# Patient Record
Sex: Male | Born: 1975 | Race: White | Hispanic: No | Marital: Single | Smoking: Never smoker
Health system: Southern US, Community
[De-identification: ages and names within clinical notes are randomized; demographics above are authoritative.]

## PROBLEM LIST (undated history)

## (undated) DIAGNOSIS — I10 Essential (primary) hypertension: Secondary | ICD-10-CM

## (undated) DIAGNOSIS — E079 Disorder of thyroid, unspecified: Secondary | ICD-10-CM

## (undated) HISTORY — DX: Disorder of thyroid, unspecified: E07.9

## (undated) HISTORY — DX: Essential (primary) hypertension: I10

## (undated) HISTORY — PX: OTHER SURGICAL HISTORY: SHX169

## (undated) HISTORY — PX: BRAIN SURGERY: SHX531

---

## 2012-07-10 ENCOUNTER — Emergency Department (HOSPITAL_COMMUNITY)
Admission: EM | Admit: 2012-07-10 | Discharge: 2012-07-10 | Disposition: A | Discharge status: Home / Self Care | Payer: BC Managed Care – PPO | Attending: Emergency Medicine | Admitting: Emergency Medicine

## 2012-07-10 ENCOUNTER — Emergency Department (HOSPITAL_COMMUNITY): Payer: BC Managed Care – PPO

## 2012-07-10 ENCOUNTER — Encounter (HOSPITAL_COMMUNITY): Payer: Self-pay | Admitting: Emergency Medicine

## 2012-07-10 DIAGNOSIS — W010XXA Fall on same level from slipping, tripping and stumbling without subsequent striking against object, initial encounter: Secondary | ICD-10-CM | POA: Insufficient documentation

## 2012-07-10 DIAGNOSIS — S82899A Other fracture of unspecified lower leg, initial encounter for closed fracture: Secondary | ICD-10-CM | POA: Insufficient documentation

## 2012-07-10 DIAGNOSIS — S82401A Unspecified fracture of shaft of right fibula, initial encounter for closed fracture: Secondary | ICD-10-CM

## 2012-07-10 DIAGNOSIS — Y9289 Other specified places as the place of occurrence of the external cause: Secondary | ICD-10-CM | POA: Insufficient documentation

## 2012-07-10 DIAGNOSIS — Y93G9 Activity, other involving cooking and grilling: Secondary | ICD-10-CM | POA: Insufficient documentation

## 2012-07-10 MED ORDER — HYDROCODONE-ACETAMINOPHEN 5-325 MG PO TABS: 2.0000 | ORAL_TABLET | Freq: Four times a day (QID) | ORAL | Status: DC | PRN

## 2012-07-10 MED ORDER — OXYCODONE-ACETAMINOPHEN 5-325 MG PO TABS
2.0000 | ORAL_TABLET | Freq: Once | ORAL | Status: AC
  Administered 2012-07-10: 2 via ORAL
  Filled 2012-07-10: qty 2

## 2012-07-10 NOTE — ED Notes (Signed)
Ortho tech paged for application of short leg stirrup splint and crutches.

## 2012-07-10 NOTE — ED Notes (Signed)
Pt alert, arrives from home, c/o left ankle pain associated from slip fall injury ,resp even unlabored,  skin pwd

## 2012-07-10 NOTE — ED Notes (Signed)
Pt has a ride home.  

## 2012-07-10 NOTE — ED Notes (Signed)
Pt slipped, injuring L ankle. Unable to see if deformed at this time.

## 2012-07-10 NOTE — ED Provider Notes (Signed)
History    This chart was scribed for non-physician practitioner working with Gwyneth Sprout, MD by Frederik Pear, ED Scribe. This patient was seen in room WTR9/WTR9 and the patient's care was started at 2133.   CSN: [id number]  Arrival date & time 07/10/12  2000   First MD Initiated Contact with Patient 07/10/12 2133      Chief Complaint  Patient presents with  . Ankle Injury    (Consider location/radiation/quality/duration/timing/severity/associated sxs/prior treatment) The history is provided by the patient. No language interpreter was used.    Micheal Day is a 37 y.o. male who presents to the Emergency Department complaining of sudden onset, constant, non-radiating, moderate left ankle pain that began earlier this evening while he was cooking dinner outside when he turned around and slipped and fell in the mud. He denies any associated symptoms. He is allergic to celebrex and sulfa antibiotics. He has not tried anything for his symptoms.  History reviewed. No pertinent past medical history.  History reviewed. No pertinent past surgical history.  No family history on file.  History  Substance Use Topics  . Smoking status: Never Smoker   . Smokeless tobacco: Not on file  . Alcohol Use: Yes      Review of Systems A complete 10 system review of systems was obtained and all systems are negative except as noted in the HPI and PMH.  Allergies  Celebrex and Sulfa antibiotics  Home Medications   Current Outpatient Rx  Name  Route  Sig  Dispense  Refill  . diphenhydrAMINE (BENADRYL) 25 MG tablet   Oral   Take 25 mg by mouth every 6 (six) hours as needed for itching. Allergies         . naproxen sodium (ANAPROX) 220 MG tablet   Oral   Take 220 mg by mouth as needed. Fever           BP 131/96  Pulse 108  Temp(Src) 99 F (37.2 C) (Oral)  Resp 16  Wt 206 lb (93.441 kg)  SpO2 97%  Physical Exam  Nursing note and vitals reviewed. Constitutional: He is  oriented to person, place, and time. He appears well-developed and well-nourished. No distress.  HENT:  Head: Normocephalic and atraumatic.  Eyes: EOM are normal. Pupils are equal, round, and reactive to light.  Neck: Normal range of motion. Neck supple. No tracheal deviation present.  Cardiovascular: Normal rate.   Good distal pulses and capillary refill.  Pulmonary/Chest: Effort normal. No respiratory distress.  Abdominal: Soft. He exhibits no distension.  Musculoskeletal: Normal range of motion. He exhibits tenderness. He exhibits no edema.  Tender over the lateral aspect of the left ankle. Moderate swelling.    Neurological: He is alert and oriented to person, place, and time.  Sensation is intact.  Skin: Skin is warm and dry.  Psychiatric: He has a normal mood and affect. His behavior is normal.    ED Course  Procedures (including critical care time  DIAGNOSTIC STUDIES: Oxygen Saturation is 97% on room air, adequate by my interpretation.    COORDINATION OF CARE:  22:05- Discussed planned course of treatment with the patient, including Percocet, who is agreeable at this time.  22:15- Medication Orders- oxycodone-acetaminophen (percocet/roxicet) 5-325 mg per tablet 2 tablet- once.   22:36- Discussed X-ray findings with the pt. He is agreeable to being discharged with pain medication, a splint and crutches, and a follow up with an orthopedist in 2 days.  Labs Reviewed - No data to  display Dg Ankle Complete Left  07/10/2012  *RADIOLOGY REPORT*  Clinical Data: Ankle pain, slipped in bud and its  LEFT ANKLE COMPLETE - 3+ VIEW  Comparison: None.  Findings: Nondisplaced oblique spiral fracture of the distal fibula extending to the level of the tibial plafond.  There is associated soft tissue swelling about the lateral malleolus.  The remainder of the bones and joints appear intact.  Bony mineralization is within normal limits.  There may be a small ankle joint effusion.  IMPRESSION:  Nondisplaced spiral fracture of the distal fibula extending to the level of the tibial plafond. Probable small ankle joint effusion.   Original Report Authenticated By: Malachy Moan, M.D.      1. Fibula fracture, right, closed, initial encounter       MDM  Distal fibular fracture. Discussed patient with Dr. Anitra Lauth, and tells him that the patient in short leg splint, given crutches, and are followup. Patient will be nonweightbearing until or followup. Pain medicines will be given. Patient is neurovascularly intact. No open wounds. Patient stable and ready for discharge.  I personally performed the services described in this documentation, which was scribed in my presence. The recorded information has been reviewed and is accurate.         Roxy Horseman, PA-C 07/10/12 2336

## 2012-07-11 NOTE — ED Provider Notes (Signed)
Medical screening examination/treatment/procedure(s) were performed by non-physician practitioner and as supervising physician I was immediately available for consultation/collaboration.   Gwyneth Sprout, MD 07/11/12 [phone]

## 2014-01-02 ENCOUNTER — Ambulatory Visit (INDEPENDENT_AMBULATORY_CARE_PROVIDER_SITE_OTHER): Payer: BC Managed Care – PPO | Admitting: Family Medicine

## 2014-01-02 ENCOUNTER — Other Ambulatory Visit: Payer: Self-pay | Admitting: Family Medicine

## 2014-01-02 ENCOUNTER — Encounter: Payer: Self-pay | Admitting: Family Medicine

## 2014-01-02 VITALS — BP 105/105 | HR 87 | Temp 97.5°F | Resp 16 | Ht 70.0 in | Wt 187.8 lb

## 2014-01-02 DIAGNOSIS — E039 Hypothyroidism, unspecified: Secondary | ICD-10-CM

## 2014-01-02 DIAGNOSIS — E049 Nontoxic goiter, unspecified: Secondary | ICD-10-CM

## 2014-01-02 DIAGNOSIS — I10 Essential (primary) hypertension: Secondary | ICD-10-CM

## 2014-01-02 DIAGNOSIS — E041 Nontoxic single thyroid nodule: Secondary | ICD-10-CM

## 2014-01-02 LAB — CBC WITH DIFFERENTIAL/PLATELET
BASOS ABS: 0.1 10*3/uL (ref 0.0–0.1)
BASOS PCT: 2 % — AB (ref 0–1)
EOS ABS: 0.1 10*3/uL (ref 0.0–0.7)
EOS PCT: 2 % (ref 0–5)
HCT: 39.9 % (ref 39.0–52.0)
Hemoglobin: 14.1 g/dL (ref 13.0–17.0)
LYMPHS ABS: 1.3 10*3/uL (ref 0.7–4.0)
Lymphocytes Relative: 28 % (ref 12–46)
MCH: 31.8 pg (ref 26.0–34.0)
MCHC: 35.3 g/dL (ref 30.0–36.0)
MCV: 89.9 fL (ref 78.0–100.0)
Monocytes Absolute: 0.4 10*3/uL (ref 0.1–1.0)
Monocytes Relative: 8 % (ref 3–12)
Neutro Abs: 2.8 10*3/uL (ref 1.7–7.7)
Neutrophils Relative %: 60 % (ref 43–77)
PLATELETS: 220 10*3/uL (ref 150–400)
RBC: 4.44 MIL/uL (ref 4.22–5.81)
RDW: 13.6 % (ref 11.5–15.5)
WBC: 4.6 10*3/uL (ref 4.0–10.5)

## 2014-01-02 LAB — COMPREHENSIVE METABOLIC PANEL
ALK PHOS: 55 U/L (ref 39–117)
ALT: 141 U/L — AB (ref 0–53)
AST: 101 U/L — ABNORMAL HIGH (ref 0–37)
Albumin: 4.8 g/dL (ref 3.5–5.2)
BILIRUBIN TOTAL: 0.4 mg/dL (ref 0.2–1.2)
BUN: 8 mg/dL (ref 6–23)
CO2: 26 meq/L (ref 19–32)
CREATININE: 0.72 mg/dL (ref 0.50–1.35)
Calcium: 9.5 mg/dL (ref 8.4–10.5)
Chloride: 100 mEq/L (ref 96–112)
Glucose, Bld: 99 mg/dL (ref 70–99)
Potassium: 4.1 mEq/L (ref 3.5–5.3)
Sodium: 136 mEq/L (ref 135–145)
Total Protein: 7.3 g/dL (ref 6.0–8.3)

## 2014-01-02 LAB — TSH: TSH: 26.752 u[IU]/mL — AB (ref 0.350–4.500)

## 2014-01-02 LAB — T4, FREE: Free T4: 0.85 ng/dL (ref 0.80–1.80)

## 2014-01-02 LAB — T3, FREE: T3, Free: 2.9 pg/mL (ref 2.3–4.2)

## 2014-01-02 MED ORDER — LISINOPRIL 10 MG PO TABS: 10.0000 mg | ORAL_TABLET | Freq: Every day | ORAL | Status: DC

## 2014-01-02 NOTE — Patient Instructions (Signed)
I will be in touch with your labs asap.  We will also get you set up for an ultrasound of your thyroid and will give you these details.   Start taking lisinopril  once a day for your blood pressure.  If you are able, check your blood pressure once or twice a week and let me know how it looks. Let's plan to recheck in about 6 weeks at clinic.

## 2014-01-02 NOTE — Progress Notes (Addendum)
[facility] [address], [place] [address] [phone]  Date:  01/02/2014   Name:  Micheal Day   DOB:  1975/05/29   MRN:  [id number]  PCP:  No primary provider on file.    Chief Complaint: Hypertension   History of Present Illness:  Micheal Day is a 38 y.o. very pleasant male patient who presents with the following:  He is here today as a new patient- he has been seen by our office for a WC issues in the past but never for a personal issue.  Dr. Marin Comment noted that he seems to have a goiter when he was here for [facility], so he is here today for follow-up.  He has noted this goiter for about 2 years, but it seems to have gotten bigger over the last few weeks.  It does not hurt, and does not give an issue with swallowing or breathing.  However he has noted it when he shaves his neck. He notes that "my blood pressure has always been high."  However he has never been on any medication for this. He has been told that his BP was high for about 15 years.  He avoids salt, and eats a lot of vegetables in addition to getting exercise  He thinks that his father has HTN but he is not sure, he does not have a lot of family and is not sure about other history.    There are no active problems to display for this patient.   No past medical history on file.  No past surgical history on file.  History  Substance Use Topics  . Smoking status: Never Smoker   . Smokeless tobacco: Not on file  . Alcohol Use: Yes    No family history on file.  Allergies  Allergen Reactions  . Celebrex [Celecoxib]   . Sulfa Antibiotics     Medication list has been reviewed and updated.  Current Outpatient Prescriptions on File Prior to Visit  Medication Sig Dispense Refill  . diphenhydrAMINE (BENADRYL) 25 MG tablet Take 25 mg by mouth every 6 (six) hours as needed for itching. Allergies      . HYDROcodone-acetaminophen (NORCO/VICODIN) 5-325 MG per tablet Take 2 tablets by mouth every 6  (six) hours as needed for pain.  15 tablet  0  . naproxen sodium (ANAPROX) 220 MG tablet Take 220 mg by mouth as needed. Fever       No current facility-administered medications on file prior to visit.    Review of Systems:  As per HPI- otherwise negative.   Physical Examination: Filed Vitals:   01/02/14 1050  BP: 164/112  Pulse: 87  Temp: 97.5 F (36.4 C)  Resp: 16   Filed Vitals:   01/02/14 1050  Height: 5' 10"  (1.778 m)  Weight: 187 lb 12.8 oz (85.186 kg)   Body mass index is 26.95 kg/(m^2). Ideal Body Weight: Weight in (lb) to have BMI = 25: 173.9  GEN: WDWN, NAD, Non-toxic, A & O x 3, normal weight, looks well HEENT: Atraumatic, Normocephalic. Neck supple. No masses, No LAD. He has a fairly large goiter that is smooth and cystic to palpation.   Bilateral TM wnl, oropharynx normal.  PEERL,EOMI.   Ears and Nose: No external deformity. CV: RRR, No M/G/R. No JVD. No thrill. No extra heart sounds. PULM: CTA B, no wheezes, crackles, rhonchi. No retractions. No resp. distress. No accessory muscle use. ABD: S, NT, ND. EXTR: No c/c/e  NEURO Normal gait.  PSYCH: Normally interactive. Conversant. Not depressed or anxious appearing.  Calm demeanor.    Assessment and Plan: Goiter - Plan: US Soft Tissue Head/Neck, TSH, T3, Free, T4, Free  Essential hypertension - Plan: CBC with Differential, Comprehensive metabolic panel, lisinopril (PRINIVIL,ZESTRIL) 10 MG tablet  Referral for ultrasound to further evaluate his goiter, and check labs as above.   Start lisinopril for HTN.  He will call me with an update regarding his BP in the next few weeks.  See patient instructions for more details.     Signed Lamar Blinks, MD  Received labs and [place] report 01/05/14 THYROID ULTRASOUND  TECHNIQUE: Ultrasound examination of the thyroid gland and adjacent soft tissues was performed.  COMPARISON: None.  FINDINGS: Right thyroid lobe  Measurements: 4.7 x 2.1 x 2.6 cm. Multiple  midpole and lower pole nodules are present. Glandular tissue is heterogeneous. 1.2 x 0.7 x 1.8 cm interpolar region solid nodule. 1.7 x 1.0 x 1.7 cm lower pole nodule. 2.1 x 1.3 x 1.7 cm complex solid and cystic lower pole nodule.  Left thyroid lobe  Measurements: 4.5 x 1.4 x 2.5 cm. Several nodules are present in the left lobe. There are 2 upper pole nodules measuring 9 mm each. Interpolar region 8 mm nodule. Complex 1.4 x 1.1 x 1.8 cm lower pole nodule. Solid 1.2 x 0.9 x 1.3 cm lower pole nodule.  Isthmus  Thickness: 12 mm. No nodules visualized.  Lymphadenopathy  None visualized.  IMPRESSION: There are multiple bilateral solid and complex nodules. The solid 1.8 cm and solid 1.7 cm nodules in the right lobe meet criteria for biopsy. Findings meet consensus criteria for biopsy. Ultrasound-guided fine needle aspiration should be considered, as per the consensus statement: Management of Thyroid Nodules Detected at [place]: Society of Radiologists in [place]. Radiology 2005; [id number].   Results for orders placed in visit on 01/02/14  CBC WITH DIFFERENTIAL      Result Value Ref Range   WBC 4.6  4.0 - 10.5 K/uL   RBC 4.44  4.22 - 5.81 MIL/uL   Hemoglobin 14.1  13.0 - 17.0 g/dL   HCT 39.9  39.0 - 52.0 %   MCV 89.9  78.0 - 100.0 fL   MCH 31.8  26.0 - 34.0 pg   MCHC 35.3  30.0 - 36.0 g/dL   RDW 13.6  11.5 - 15.5 %   Platelets 220  150 - 400 K/uL   Neutrophils Relative % 60  43 - 77 %   Neutro Abs 2.8  1.7 - 7.7 K/uL   Lymphocytes Relative 28  12 - 46 %   Lymphs Abs 1.3  0.7 - 4.0 K/uL   Monocytes Relative 8  3 - 12 %   Monocytes Absolute 0.4  0.1 - 1.0 K/uL   Eosinophils Relative 2  0 - 5 %   Eosinophils Absolute 0.1  0.0 - 0.7 K/uL   Basophils Relative 2 (*) 0 - 1 %   Basophils Absolute 0.1  0.0 - 0.1 K/uL   Smear Review Criteria for review not met    COMPREHENSIVE METABOLIC PANEL      Result Value Ref Range   Sodium 136  135 - 145  mEq/L   Potassium 4.1  3.5 - 5.3 mEq/L   Chloride 100  96 - 112 mEq/L   CO2 26  19 - 32 mEq/L   Glucose, Bld 99  70 - 99 mg/dL   BUN 8  6 -  23 mg/dL   Creat 0.72  0.50 - 1.35 mg/dL   Total Bilirubin 0.4  0.2 - 1.2 mg/dL   Alkaline Phosphatase 55  39 - 117 U/L   AST 101 (*) 0 - 37 U/L   ALT 141 (*) 0 - 53 U/L   Total Protein 7.3  6.0 - 8.3 g/dL   Albumin 4.8  3.5 - 5.2 g/dL   Calcium 9.5  8.4 - 10.5 mg/dL  TSH      Result Value Ref Range   TSH 26.752 (*) 0.350 - 4.500 uIU/mL  T3, FREE      Result Value Ref Range   T3, Free 2.9  2.3 - 4.2 pg/mL  T4, FREE      Result Value Ref Range   Free T4 0.85  0.80 - 1.80 ng/dL   Also did an acute hep panel which was negative (to follow-up elevated LFTs).   Called to go over with pt but he did not answer at this time.  LMOM that I will try back later  Called him on 9/4: discussed with pt  1. Start synthroid 63mg. Plan to recheck TSH in about 3 weeks either with u[place] with endocrine 2. Refer to endocrinology 3. LFTs are high.  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  He is going to work on drinking less and we will plan to recheck LFTs in about 1 months

## 2014-01-04 ENCOUNTER — Ambulatory Visit
Admission: RE | Admit: 2014-01-04 | Discharge: 2014-01-04 | Disposition: A | Discharge status: Home / Self Care | Payer: BC Managed Care – PPO | Source: Ambulatory Visit | Attending: Family Medicine | Admitting: Family Medicine

## 2014-01-04 DIAGNOSIS — E049 Nontoxic goiter, unspecified: Secondary | ICD-10-CM

## 2014-01-04 LAB — HEPATITIS PANEL, ACUTE
HCV AB: NEGATIVE
HEP B S AG: NEGATIVE
Hep A IgM: NONREACTIVE
Hep B C IgM: NONREACTIVE

## 2014-01-05 ENCOUNTER — Encounter: Payer: Self-pay | Admitting: Family Medicine

## 2014-01-05 NOTE — Addendum Note (Signed)
Addended by: Abbe Amsterdam C on: 01/05/2014 05:06 PM   Modules accepted: Orders

## 2014-01-06 ENCOUNTER — Encounter: Payer: Self-pay | Admitting: Family Medicine

## 2014-01-06 MED ORDER — LEVOTHYROXINE SODIUM 25 MCG PO TABS: 25.0000 ug | ORAL_TABLET | Freq: Every day | ORAL | Status: DC

## 2014-01-06 NOTE — Addendum Note (Signed)
Addended by: Abbe Amsterdam C on: 01/06/2014 05:53 PM   Modules accepted: Orders

## 2014-01-09 ENCOUNTER — Encounter: Payer: Self-pay | Admitting: Family Medicine

## 2014-01-10 NOTE — Telephone Encounter (Signed)
That sounds pretty good.  I would like to get your top number a little lower, but certainly progress.  If you would, check your pressure every 2 or 3 days for the next 2 weeks and then email me with some more readings.  Then we can decide if we should increase your dose of medication.  Thanks for the update!

## 2014-01-19 ENCOUNTER — Encounter: Payer: Self-pay | Admitting: Family Medicine

## 2014-01-20 ENCOUNTER — Encounter: Payer: Self-pay | Admitting: Internal Medicine

## 2014-02-01 ENCOUNTER — Other Ambulatory Visit: Payer: Self-pay | Admitting: *Deleted

## 2014-02-01 ENCOUNTER — Encounter: Payer: Self-pay | Admitting: Endocrinology

## 2014-02-01 ENCOUNTER — Ambulatory Visit (INDEPENDENT_AMBULATORY_CARE_PROVIDER_SITE_OTHER): Payer: BC Managed Care – PPO | Admitting: Endocrinology

## 2014-02-01 ENCOUNTER — Other Ambulatory Visit: Payer: Self-pay | Admitting: Endocrinology

## 2014-02-01 VITALS — BP 124/82 | HR 100 | Temp 98.4°F | Resp 14 | Ht 70.0 in | Wt 186.4 lb

## 2014-02-01 DIAGNOSIS — E039 Hypothyroidism, unspecified: Secondary | ICD-10-CM

## 2014-02-01 DIAGNOSIS — E042 Nontoxic multinodular goiter: Secondary | ICD-10-CM

## 2014-02-01 DIAGNOSIS — E049 Nontoxic goiter, unspecified: Secondary | ICD-10-CM

## 2014-02-01 LAB — TSH: TSH: 18.537 u[IU]/mL — AB (ref 0.350–4.500)

## 2014-02-01 LAB — T4, FREE: Free T4: 0.9 ng/dL (ref 0.80–1.80)

## 2014-02-01 NOTE — Progress Notes (Signed)
Patient ID: Micheal Day, male   DOB: 04/22/1976, 38 y.o.   MRN: [id number]    Reason for Appointment: Goiter, new consultation    History of Present Illness:   The patient's thyroid enlargement was first discovered in 6/15 when he was seen at an urgent care Center   He said that he did not notice a swelling in his neck until a couple months ago because he was having a long beard which he just removed in June   He has had  no local discomfort or difficulty with swallowing  Does not feel like he  has any choking sensation in  the neck    Because of the swelling in the front of his neck he went to his primary care physician in August At that time his blood pressure was significantly high and he was started on lisinopril Also he thinks that since about the time of his last visit with her he has started feeling more tired Does not complain of any significant change in weight recently, no cold intolerance, constipation or dry skin  He was evaluated with thyroid functions and ultrasound with the following findings   Lab Results  Component Value Date   FREET4 0.85 01/02/2014   TSH 26.752* 01/02/2014      He has had an ultrasound exam  9/15 which showed:  Right thyroid lobe  Measurements: 4.7 x 2.1 x 2.6 cm. Multiple midpole and lower pole  nodules are present. Glandular tissue is heterogeneous. 1.2 x 0.7 x  1.8 cm interpolar region solid nodule. 1.7 x 1.0 x 1.7 cm lower pole  nodule. 2.1 x 1.3 x 1.7 cm complex solid and cystic lower pole nodule.   Left thyroid lobe  Measurements: 4.5 x 1.4 x 2.5 cm. Several nodules are present in the  left lobe. There are 2 upper pole nodules measuring 9 mm each.  Interpolar region 8 mm nodule. Complex 1.4 x 1.1 x 1.8 cm lower pole  nodule. Solid 1.2 x 0.9 x 1.3 cm lower pole nodule.       Medication List       This list is accurate as of: 02/01/14  2:24 PM.  Always use your most recent med list.               diphenhydrAMINE 25 MG  tablet  Commonly known as:  BENADRYL  Take 25 mg by mouth every 6 (six) hours as needed for itching. Allergies     levothyroxine 25 MCG tablet  Commonly known as:  SYNTHROID, LEVOTHROID  Take 1 tablet (25 mcg total) by mouth daily before breakfast.     lisinopril 10 MG tablet  Commonly known as:  PRINIVIL,ZESTRIL  Take 1 tablet (10 mg total) by mouth daily.        Allergies:  Allergies  Allergen Reactions  . Celebrex [Celecoxib]   . Sulfa Antibiotics     No past medical history on file.  No past surgical history on file.  Family History  Problem Relation Age of Onset  . Hypertension Father   . Hypertension Paternal Uncle   . Thyroid disease Paternal Grandmother   . Diabetes Neg Hx     Social History:  reports that he has never smoked. He does not have any smokeless tobacco history on file. He reports that he drinks alcohol. His drug history is not on file.   Review of Systems:  Wt Readings from Last 3 Encounters:  02/01/14 186 lb 6.4 oz (84.55 kg)  01/02/14 187 lb 12.8 oz (85.186 kg)  07/10/12 206 lb (93.441 kg)    There is  history of high blood pressure.             No  history of Diabetes.             Examination:   BP 124/82  Pulse 100  Temp(Src) 98.4 F (36.9 C)  Resp 14  Ht 5\' 10"  (1.778 m)  Wt 186 lb 6.4 oz (84.55 kg)  BMI 26.75 kg/m2  SpO2 97%   General Appearance: pleasant,  averagely built and nourished, no facial swelling present          Eyes: No abnormal prominence or eyelid swelling.          Neck: The thyroid is enlarged  mostly in the midline about 5 cm across, relatively firm and slightly irregular. N Neck circumference is    44 cm   over the thyroid There is no stridor. Pemberton sign is negative There is no lymphadenopathy .    Cardiovascular: Normal  heart sounds, no murmur Respiratory:  Lungs clear Neurological: REFLEXES: at biceps  and ankles are difficult to elicit  Skin: no rash, excessive dryness          Assessment/Plan:  Multinodular goiter. Although he has relatively large nodules about 1.7 cm in size he has about 3-4 nodules which are similar in size and without any dominant nodule He has mostly the nodule right lobe although on exam he has mostly an enlargement in the midline   Currently he is asymptomatic   Also he appears to have hypothyroidism with a significantly high TSH level He has some nonspecific fatigue but does not appear to have typical symptoms of hypothyroidism and also looks euthyroid on exam  Discussed with the patient that since he does not have a dominant nodule he can be followed with serial ultrasound exams and consider needle aspiration only if he has any significant change in any one of the nodules. Also currently he has no nodules with a high index of suspicion for malignancy It may well be that his goiter will improve in size with thyroid supplementation also  For his hypothyroidism his TSH needs to be checked today; most likely he will need a significantly larger amount of supplementation because of his TSH of 26 baseline   Mallorey Odonell 02/01/2014

## 2014-02-02 ENCOUNTER — Encounter: Payer: Self-pay | Admitting: Family Medicine

## 2014-02-02 ENCOUNTER — Other Ambulatory Visit: Payer: Self-pay | Admitting: *Deleted

## 2014-02-02 MED ORDER — LEVOTHYROXINE SODIUM 75 MCG PO TABS: 75.0000 ug | ORAL_TABLET | Freq: Every day | ORAL | Status: DC

## 2014-02-02 NOTE — Progress Notes (Signed)
Quick Note:  Please let patient know that the lab result shows thyroid level is still low, change prescription to 75 mcg daily of levothyroxine ______

## 2014-03-27 ENCOUNTER — Encounter: Payer: Self-pay | Admitting: Endocrinology

## 2014-03-27 ENCOUNTER — Other Ambulatory Visit (INDEPENDENT_AMBULATORY_CARE_PROVIDER_SITE_OTHER): Payer: BC Managed Care – PPO

## 2014-03-27 DIAGNOSIS — E049 Nontoxic goiter, unspecified: Secondary | ICD-10-CM

## 2014-03-27 DIAGNOSIS — E039 Hypothyroidism, unspecified: Secondary | ICD-10-CM

## 2014-03-27 LAB — TSH: TSH: 17.88 u[IU]/mL — ABNORMAL HIGH (ref 0.35–4.50)

## 2014-03-27 LAB — T4, FREE: Free T4: 0.72 ng/dL (ref 0.60–1.60)

## 2014-04-03 ENCOUNTER — Ambulatory Visit (INDEPENDENT_AMBULATORY_CARE_PROVIDER_SITE_OTHER): Payer: BC Managed Care – PPO | Admitting: Endocrinology

## 2014-04-03 ENCOUNTER — Encounter: Payer: Self-pay | Admitting: Endocrinology

## 2014-04-03 VITALS — BP 134/91 | HR 96 | Temp 98.6°F | Resp 14 | Ht 70.0 in | Wt 184.0 lb

## 2014-04-03 DIAGNOSIS — E038 Other specified hypothyroidism: Secondary | ICD-10-CM

## 2014-04-03 DIAGNOSIS — E042 Nontoxic multinodular goiter: Secondary | ICD-10-CM

## 2014-04-03 MED ORDER — LEVOTHYROXINE SODIUM 125 MCG PO TABS: 125.0000 ug | ORAL_TABLET | Freq: Every day | ORAL | Status: DC

## 2014-04-03 NOTE — Progress Notes (Signed)
Patient ID: Micheal Day L Endsley, male   DOB: 01/29/1976, 38 y.o.   MRN: [id number]    Reason for Appointment: Goiter and hypothyroidism, follow-up    History of Present Illness:   The patient's thyroid enlargement was first discovered in 6/15 when he was seen at an urgent care Center   He has had  no local discomfort or difficulty with swallowing  Does not feel like he  has any choking sensation in  the neck except lying down in certain positions and this is recently not changed   On his last visit he felt he was feeling more tired but is working long hours and unusual times Today he does not feel unusually fatigued His PCP had started him on 25 g of levothyroxine and this was increased to 75 g since TSH was still high at 18.5 Does not complain of any significant change in weight recently, no cold intolerance, constipation or dry skin  However he still has about the same TSH level   Lab Results  Component Value Date   FREET4 0.72 03/27/2014   FREET4 0.90 02/01/2014   FREET4 0.85 01/02/2014   TSH 17.88* 03/27/2014   TSH 18.537* 02/01/2014   TSH 26.752* 01/02/2014    He has had an ultrasound exam  9/15 which showed:  Right thyroid lobe  Measurements: 4.7 x 2.1 x 2.6 cm. Multiple midpole and lower pole  nodules are present. Glandular tissue is heterogeneous. 1.2 x 0.7 x  1.8 cm interpolar region solid nodule. 1.7 x 1.0 x 1.7 cm lower pole  nodule. 2.1 x 1.3 x 1.7 cm complex solid and cystic lower pole nodule.   Left thyroid lobe  Measurements: 4.5 x 1.4 x 2.5 cm. Several nodules are present in the  left lobe. There are 2 upper pole nodules measuring 9 mm each.  Interpolar region 8 mm nodule. Complex 1.4 x 1.1 x 1.8 cm lower pole  nodule. Solid 1.2 x 0.9 x 1.3 cm lower pole nodule.       Medication List       This list is accurate as of: 04/03/14 10:07 AM.  Always use your most recent med list.               diphenhydrAMINE 25 MG tablet  Commonly known as:   BENADRYL  Take 25 mg by mouth every 6 (six) hours as needed for itching. Allergies     levothyroxine 75 MCG tablet  Commonly known as:  SYNTHROID, LEVOTHROID  Take 1 tablet (75 mcg total) by mouth daily.     lisinopril 10 MG tablet  Commonly known as:  PRINIVIL,ZESTRIL  Take 1 tablet (10 mg total) by mouth daily.        Allergies:  Allergies  Allergen Reactions  . Celebrex [Celecoxib]   . Sulfa Antibiotics     No past medical history on file.  No past surgical history on file.  Family History  Problem Relation Age of Onset  . Hypertension Father   . Hypertension Paternal Uncle   . Thyroid disease Paternal Grandmother   . Diabetes Neg Hx     Social History:  reports that he has never smoked. He does not have any smokeless tobacco history on file. He reports that he drinks alcohol. His drug history is not on file.   Review of Systems:  Wt Readings from Last 3 Encounters:  04/03/14 184 lb (83.462 kg)  02/01/14 186 lb 6.4 oz (84.55 kg)  01/02/14 187 lb 12.8 oz (  85.186 kg)    There is a  history of high blood pressure treated with lisinopril .             No  history of Diabetes.             Examination:   BP 134/91 mmHg  Pulse 96  Temp(Src) 98.6 F (37 C)  Resp 14  Ht 5\' 10"  (1.778 m)  Wt 184 lb (83.462 kg)  BMI 26.40 kg/m2  SpO2 98%         Neck: The thyroid is enlarged  mostly in the midline about 5 cm across, somewhat firm and relatively smooth  Neurological: REFLEXES: at biceps  and ankles are difficult to elicit      Assessment/Plan:  HYPOTHYROIDISM: Although he is not very symptomatic his TSH is still significantly high at 17 with currently taking 75 g, similar to when he was taking 25 g Will increase his dose to 125 g now and have him come back in 3 months for follow-up  Multinodular goiter. He has relatively large nodules about 1.7 cm in size he has about 3-4 nodules which are similar in size and without any dominant nodule.   His  palpable nodule in the midline appears about the same Since his thyroid levels have been low he may see improvement in the size of his nodules with thyroid supplementation and will wait till middle of next year to repeat his ultrasound    [facility] HospitalKUMAR,Dakiyah Heinke 04/03/2014

## 2014-04-03 NOTE — Patient Instructions (Signed)
1 1/2 pills daily till Rx out

## 2014-04-20 ENCOUNTER — Encounter (HOSPITAL_COMMUNITY): Payer: Self-pay | Admitting: Psychology

## 2014-04-21 ENCOUNTER — Other Ambulatory Visit (HOSPITAL_COMMUNITY): Payer: BLUE CROSS/BLUE SHIELD | Attending: Psychiatry | Admitting: Psychology

## 2014-04-21 DIAGNOSIS — F1020 Alcohol dependence, uncomplicated: Secondary | ICD-10-CM | POA: Diagnosis present

## 2014-04-21 DIAGNOSIS — I10 Essential (primary) hypertension: Secondary | ICD-10-CM | POA: Diagnosis not present

## 2014-04-21 DIAGNOSIS — Z87820 Personal history of traumatic brain injury: Secondary | ICD-10-CM | POA: Insufficient documentation

## 2014-04-21 DIAGNOSIS — E039 Hypothyroidism, unspecified: Secondary | ICD-10-CM | POA: Diagnosis not present

## 2014-04-21 DIAGNOSIS — E042 Nontoxic multinodular goiter: Secondary | ICD-10-CM | POA: Insufficient documentation

## 2014-04-21 DIAGNOSIS — F4310 Post-traumatic stress disorder, unspecified: Secondary | ICD-10-CM | POA: Diagnosis not present
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
## 2014-04-23 ENCOUNTER — Encounter (HOSPITAL_COMMUNITY): Payer: Self-pay | Admitting: Psychology

## 2014-04-23 DIAGNOSIS — F10230 Alcohol dependence with withdrawal, uncomplicated: Secondary | ICD-10-CM | POA: Insufficient documentation

## 2014-04-23 NOTE — Progress Notes (Signed)
    Daily Group Progress Note  Program: CD-IOP   Group Time: 1-2:30 pm  Participation Level: Active  Behavioral Response: Appropriate and Sharing  Type of Therapy: Process Group  Topic: Process: the first part of group was spent in process. Members shared about current issues and concerns in early recovery. Two new group members were present and they were invited to share a little about themselves and what circumstances had brought them here. The medical director met with new group members throughout the session. Drug tests results were returned to some members while others were collected from new members.  Group Time: 2:45- 4pm  Participation Level: Active  Behavioral Response: Sharing  Type of Therapy: Psycho-education Group  Topic: Bio-Psycho-Social-Spiritual: a psycho-ed was presented in he second half of group. The presentation reviewed the 4 different elements believed to contribute to one's chemical dependency. Members were taught that having the genes doesn't necessarily guarantee addiction, but other factors contribute to that dependency condition. The session concluded with members disclosing plans for the weekend. All group members were provided with 2 copies of a weekly calendar and they were instructed to bring them back on Monday completed with their plans and commitments for the week ahead.   Summary: The patient was new to the group and shared a little about himself when asked to introduce himself. The patient explained that he had gone to work on December 7th after a heavy day of drinking on that previous Sunday. The [employer] had won and the [employer] had lost. The patient admitted he drank a lot of beer, but then some friends came by with Shearon Stalls and he drank even more. He went to work at 6 am and around 2 pm he was asked to go to HR and provide a BAC. Someone had reported smelling alcohol on him. The patient shared that he blew a .15. He had been suspended for a few  days, but is back at work. He is required to complete this program or he will lose his job. In part two of the session, the patient reported he does not care what anybody thinks of him. He denied having any interest in a relationship and noted that right now he lives with his father and takes care of him to some degree. He patient seemed pretty relaxed in his first group session and responded well to this intervention. The patient's sobriety date is 12/15.    Family Program: Family present? No   Name of family member(s):   UDS collected: Yes Results:   AA/NA attended?: No, but he is new to the group  Sponsor?: No   Micheal Day, LCAS

## 2014-04-24 ENCOUNTER — Encounter (HOSPITAL_COMMUNITY): Payer: Self-pay | Admitting: Medical

## 2014-04-24 ENCOUNTER — Other Ambulatory Visit (HOSPITAL_COMMUNITY): Payer: BLUE CROSS/BLUE SHIELD | Admitting: Medical

## 2014-04-24 DIAGNOSIS — F1020 Alcohol dependence, uncomplicated: Secondary | ICD-10-CM

## 2014-04-24 DIAGNOSIS — Z811 Family history of alcohol abuse and dependence: Secondary | ICD-10-CM

## 2014-04-24 DIAGNOSIS — I10 Essential (primary) hypertension: Secondary | ICD-10-CM

## 2014-04-24 DIAGNOSIS — F4310 Post-traumatic stress disorder, unspecified: Secondary | ICD-10-CM

## 2014-04-24 HISTORY — DX: Essential (primary) hypertension: I10
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
## 2014-04-25 ENCOUNTER — Encounter (HOSPITAL_COMMUNITY): Payer: Self-pay | Admitting: Psychology

## 2014-04-25 NOTE — Progress Notes (Signed)
Daily Group Progress Note  Program: CD-IOP   Group Time: 1-2:30 pm  Participation Level: Active  Behavioral Response: Appropriate and Sharing  Type of Therapy: Process Group  Topic: Process:  The first part of group was spent in process. Members shared about current issues and concerns in early recovery.  A new member was present and he introduced himself and shared about his reasons for being here in the program. Drug tests were collected from some members and the results of those collected on Friday returned. During this session, new patients met with the medical director in addition to current members who needed to discuss progress. A member brought a wonderful dinner of barbecue and coleslaw that he had prepared along with broccoli and cheese casserole another member had brought. The group shared about Christmas memories and rituals while we enjoyed this sumptuous meal.   Group Time: 2:45- 4pm  Participation Level: Active  Behavioral Response: Sharing  Type of Therapy: Psycho-education Group  Topic: Relationships:  Health versus Unhealthy Relationships and what they look like. The second half of group was spent in a psycho-ed on relationships. A handout was provided detailing specific examples of ways that relationships are unhealthy. Members took turns reading those characteristics and discussing them at length. Many provided examples of how they had engaged in these dysfunctional behaviors and identified what they hoped to get from these behaviors. Every member could relate to one of those listed on the handouts. The group also took turns reading the characteristics of a health relationship. No one disavowed the truth of those listed, but many agreed that it was hard to behave in these ways for fear of the partner's response. The session proved very engaging with everyone sharing something about relationships, past or present.   Summary: The patient reported he had kept himself  busy over the weekend. He reported he had spent most of Saturday deer hunting. His family owns 60+ acres of land and he has lots of opportunities to hunt. The patient reported that he had intended to go to his first [facility] meeting on Sunday, but a friend had phoned him and asked if he would install her new hot water heater. He agreed to do this for her, but it took much longer than he had anticipated. The patient reported he fully intended to go to a meeting tomorrow evening after work. He reported that a friend phoned him to come to the bar and watch football, but the patient explained that he wasn't drinking and was enrolled in a program. The fellow expressed his hope that they would see him once he is done with the program. The patient agreed that he now realizes who some of his friends are or aren't. He noted th The patient was able to identify a number of things that he does that is considered unhealthy, but agreed with another member that the women he has dated always want to change him. The patient reported that he likes himself as he is and doesn't want to make any major changes. Someone noted that eliminating alcohol is going to be a big change, but the patient reported he wants to change that. The patient met with the program director for part of group today. He responded well to this intervention and per his report, his sobriety date is 12/15.   Family Program: Family present? No   Name of family member(s):   UDS collected: Yes Results: not back yet  AA/NA attended?: No, he is new  to the program, but stated he would attend a meeting tomorrow after work.    Sponsor?: No   Layton Naves, LCAS

## 2014-04-26 ENCOUNTER — Other Ambulatory Visit (HOSPITAL_COMMUNITY): Payer: BLUE CROSS/BLUE SHIELD | Admitting: Psychology

## 2014-04-26 DIAGNOSIS — F1020 Alcohol dependence, uncomplicated: Secondary | ICD-10-CM | POA: Diagnosis not present

## 2014-05-01 ENCOUNTER — Other Ambulatory Visit (HOSPITAL_COMMUNITY): Payer: BLUE CROSS/BLUE SHIELD

## 2014-05-01 ENCOUNTER — Encounter (HOSPITAL_COMMUNITY): Payer: Self-pay | Admitting: Psychology

## 2014-05-01 ENCOUNTER — Telehealth (HOSPITAL_COMMUNITY): Payer: Self-pay | Admitting: Psychology
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
## 2014-05-03 ENCOUNTER — Other Ambulatory Visit (HOSPITAL_COMMUNITY): Payer: BLUE CROSS/BLUE SHIELD | Admitting: Psychology

## 2014-05-03 DIAGNOSIS — F1020 Alcohol dependence, uncomplicated: Secondary | ICD-10-CM | POA: Diagnosis not present

## 2014-05-04 ENCOUNTER — Encounter (HOSPITAL_COMMUNITY): Payer: Self-pay | Admitting: Psychology

## 2014-05-04 NOTE — Progress Notes (Signed)
Daily Group Progress Note  Program: CD-IOP   Group Time: 1-2:30 pm  Participation Level: Active  Behavioral Response: Appropriate  Type of Therapy: Process Group  Topic: Group Process: the first part of group was spent in process: members shared about what they have been doing to support their recovery since we last met. A new member was present and he was asked to introduce himself to his new group members and share a little about what has brought him here. He received a warm welcome and appeared very comfortable in this first treatment session.   Group Time: 2:45- 4pm  Participation Level: Active  Behavioral Response: Appropriate and Sharing  Type of Therapy: Psycho-education Group  Topic: Stress/Stress Reduction/Breath: the second half of group was spent in a psycho-ed on the destructive problems associated with Stress, how to reduce one's stress and the power of Breath. Handouts were provided and the group shared their understanding of what we mean by "Fight or Flight". There were some fairly accurate descriptions and members provided examples of how they hold stress and what things prove particularly stressful to them. A discussion ensued about the physical effects of long term stress. The power of breath was discussed at length and afterwards, group members spent 5 minutes quietly breathing. The session ended with members sharing their plans for the upcoming weekend. The importance of meetings, talking with others in recovery and good self-care was emphasized.   Summary: The patient checked-in with a new sobriety date of 12/26. When the check-in was completed, we returned to him and I asked him to share the events of Christmas Day. The patient reported he had gone over to his Uncle's house with his father. They were all in the kitchen and somebody handed him a beer and he drank it. He was teary as he recounted this use and reported he went on to drink 6 or 7 beers. The patient  admitted it all happened so fast he didn't even realize he had drank until he had finished the first beer. When asked if he had usually drank at his uncle's home, the patient confirmed that this was very typical. Another member asked if he had told his relatives that he was in a program and trying to save his job? The patient admitted he had not told anyone. The group discussed how he might have done things differently and helped him see that going to this house and thinking he would not drink was very unrealistic. He either should have told everyone there what was going on, or, more realistically, he probably should not have gone there at all. The patient was receptive to these comments and feedback. He reported he had gone to the [employer] this noon. In the second half of group, the patient reported he does get angry with people's poor driving and reported he seems to get stress everywhere in his body. When I pressed him on this, he identified his shoulders as holding most of the stress. The patient reported he almost fell asleep during the breathing exercise. As the session ended, he reported he would spend the days hunting and doing work at home. He also agreed to go to meetings. He admitted he was looking forward to getting back to work next Monday. His office has been closed for 2 weeks. The patient has admitted 1 relapse, but denied using anything prior to that since the 15th. He has denied use despite 2 drug tests that show increased amounts of alcohol  intake. We will continue to follow closely in the days ahead. He will also have to increase his AA attendance if he intends to remain in the program. The patient's new sobriety date remains 12/26.    Family Program: Family present? No   Name of family member(s):   UDS collected: Yes Results: not back from lab  AA/NA attended?: YesWednesday  Sponsor?: No   Marijayne Rauth, LCAS

## 2014-05-08 ENCOUNTER — Other Ambulatory Visit (HOSPITAL_COMMUNITY): Payer: BLUE CROSS/BLUE SHIELD | Attending: Psychiatry | Admitting: Psychology

## 2014-05-08 ENCOUNTER — Telehealth (HOSPITAL_COMMUNITY): Payer: Self-pay | Admitting: Psychology

## 2014-05-08 DIAGNOSIS — E039 Hypothyroidism, unspecified: Secondary | ICD-10-CM | POA: Insufficient documentation

## 2014-05-08 DIAGNOSIS — I10 Essential (primary) hypertension: Secondary | ICD-10-CM | POA: Insufficient documentation

## 2014-05-08 DIAGNOSIS — F4310 Post-traumatic stress disorder, unspecified: Secondary | ICD-10-CM | POA: Insufficient documentation

## 2014-05-08 DIAGNOSIS — F1020 Alcohol dependence, uncomplicated: Secondary | ICD-10-CM | POA: Insufficient documentation

## 2014-05-09 ENCOUNTER — Encounter (HOSPITAL_COMMUNITY): Payer: Self-pay | Admitting: Psychology

## 2014-05-09 NOTE — Progress Notes (Signed)
    Daily Group Progress Note  Program: CD-IOP   Group Time: 1-2:30 pm  Participation Level: Active  Behavioral Response: Appropriate and Sharing  Type of Therapy: Process Group  Topic: Process; the first part of group was spent in process. Members shared about the past holiday weekend and any events or issues that challenged their sobriety. Drug test results were returned and one test collected from a member who had missed the last group session. There was good discussion among the group with feedback and support provided to those members struggling in early recovery.   Group Time: 2:45- 4pm  Participation Level: Active  Behavioral Response: Appropriate and Sharing  Type of Therapy: Psycho-education Group  Topic: Stress: the second part of group was spent in a psycho-ed on the physical and emotional effects of stress. Members were asked to identify some of the ways that stress manifests in their bodies and minds. When asked to explain how they presently cope with stressors, most admitted they had used alcohol and drugs and are only learning about different ways to handle stressors. A 5-minutte breathing exercise was observed with members quietly breathing while music played quietly in the background. Everyone agreed that they were able to calm themselves to some degree, but some members admitted they could not stop the racing thoughts in their heads. Members were instructed to practice this exercise 2 times per day in order to address current stressors and relieve or reduce the effects of future stress.   Summary: The patient reported he had kept busy this weekend. He had attended 3 meetings at the 8 am [employer] and followed up with some comments that displayed he was listening in those rooms. The patient reported he had fried up cornbread, cooked a side of beef and made collard greens. He delivered them to about 15 people in the area and explained that he and his father always help  feed some of their poor neighbors on this day. When asked if he had experienced any cravings, the patient confirmed that he had had cravings to drink and found himself going outside and hunting squirrels and deer to keep himself occupied. He confirmed that New Year's Eve was no different than any other day.he would drink and get drunk every time. In the second half of group, the patient identified being very irritable and downright aggressive when he is stressed out. He explained that at work, if he is having a problem with a piece of machinery, he will usually tell others to "F___ Off" if they try to help. He agreed, though, that the alcohol contributes to his aggressiveness. As a result of being sober, he reported he is able to think through things a little more and remain more logical. The patient made some good comments and responded well to this intervention. He was very pleased with the results of his last drug test, which was negative. His sobriety date is 12/26.    Family Program: Family present? No   Name of family member(s):   UDS collected: No Results:  AA/NA attended?: YesFriday, Saturday and Sunday  Sponsor?: No   Javonda Suh, LCAS

## 2014-05-10 ENCOUNTER — Other Ambulatory Visit (HOSPITAL_COMMUNITY): Payer: BLUE CROSS/BLUE SHIELD | Admitting: Psychology

## 2014-05-10 ENCOUNTER — Encounter (HOSPITAL_COMMUNITY): Payer: Self-pay | Admitting: Licensed Clinical Social Worker

## 2014-05-10 DIAGNOSIS — F1020 Alcohol dependence, uncomplicated: Secondary | ICD-10-CM

## 2014-05-11 ENCOUNTER — Encounter (HOSPITAL_COMMUNITY): Payer: Self-pay | Admitting: Psychology

## 2014-05-11 NOTE — Progress Notes (Signed)
    Daily Group Progress Note  Program: CD-IOP   Group Time: 1-2:30 pm  Participation Level: Active  Behavioral Response: Appropriate and Sharing  Type of Therapy: Process Group  Topic: Group Process: the first part of group was spent in process. Members were directed to identify what they had done to support their recovery since the last group session. One member reported she would be picking up her 60 day chip tonight. The group applauded this news. Another member shared that he had attended 4 AA meetings since our last group session, less than 48 hours ago. More applause from the group was provided. A member was asked to read the Daily Reflection. It was titled, "The Victory of Surrender". This reading tied in with the foundational aspect of recovery, which is surrendering to this powerlessness of this disease. Drug tests were collected from 3 group members while the result of another drug test was returned to the member. He was questioned at length due to the falsified nature of the results. The group member denied having added Suboxone to his drug test.   Group Time: 2:45- 4pm  Participation Level: Active  Behavioral Response: Sharing  Type of Therapy: Psycho-education Group  Topic: Psycho-Ed: The second half of group was spent in a psycho-ed on Feelings. Members were provided handouts on Feelings. The first page was covered with different faces reflecting different feelings. Members identified what feelings they were having right at this moment. These disclosures brought a variety of expressions followed by further explanations. The importance of being able to identify what one is feelings was emphasized as well as the realization that feelings don't have to be acted on and sometimes we feel sad or discouraged or lonely and that is okay too. There was good feedback and some disclosures that proved much more insight into some of these members' inner lives.    Summary: The patient  reported that he is enjoying being back at work. He stated he worked 10 hours yesterday and went home and went to bed, and did not attend any meetings. He reported he said a rosary on Tuesday evening. This proved interesting as the patient has never shared he was Catholic or felt any strong religious connections.  At least two group members could appreciate the effort this had taken and could honor it.  When asked about his current feelings he stated that he felt indifferent and couldn't or wouldn't expound on why he was feeling indifferent. When questioned further, the patient stated he wasn't feeling anything.  Another group member agreed and said he felt indifferent as well. The patient's sobriety date remains 12/26.   Family Program: Family present? No   Name of family member(s):   UDS collected: Yes Results: not back yet  AA/NA attended?: No  Sponsor?: No   Anjeanette Petzold, LCAS

## 2014-05-12 ENCOUNTER — Other Ambulatory Visit (HOSPITAL_COMMUNITY): Payer: BLUE CROSS/BLUE SHIELD | Admitting: Psychology

## 2014-05-12 DIAGNOSIS — F4310 Post-traumatic stress disorder, unspecified: Secondary | ICD-10-CM | POA: Insufficient documentation

## 2014-05-12 DIAGNOSIS — Z811 Family history of alcohol abuse and dependence: Secondary | ICD-10-CM | POA: Insufficient documentation

## 2014-05-12 DIAGNOSIS — F1020 Alcohol dependence, uncomplicated: Secondary | ICD-10-CM

## 2014-05-12 DIAGNOSIS — I10 Essential (primary) hypertension: Secondary | ICD-10-CM

## 2014-05-15 ENCOUNTER — Encounter (HOSPITAL_COMMUNITY): Payer: Self-pay | Admitting: Licensed Clinical Social Worker

## 2014-05-15 ENCOUNTER — Encounter (HOSPITAL_COMMUNITY): Payer: Self-pay | Admitting: Psychology

## 2014-05-15 ENCOUNTER — Other Ambulatory Visit (HOSPITAL_COMMUNITY): Payer: BLUE CROSS/BLUE SHIELD | Admitting: Psychology

## 2014-05-15 DIAGNOSIS — F1020 Alcohol dependence, uncomplicated: Secondary | ICD-10-CM | POA: Diagnosis not present

## 2014-05-15 DIAGNOSIS — Z811 Family history of alcohol abuse and dependence: Secondary | ICD-10-CM

## 2014-05-15 NOTE — Progress Notes (Signed)
Daily Group Progress Note  Program: CD-IOP   Group Time: 1-2:30 pm  Participation Level: Active  Behavioral Response: Appropriate and Sharing  Type of Therapy: Process Group  Topic: Process: The first part of group was spent in process. Members shared about current issues and concerns in early recovery. Group members compared the ways they are interacting with others now as opposed to how they used to behave and interact while in their active addiction. There was good feedback and discussion among the group. During this session, the program director met with the new member for his first session along with the discharging member.  Group Time: 2:45-4 pm  Participation Level: Active  Behavioral Response: Appropriate  Type of Therapy: Psycho-education Group  Topic: Psycho-Ed: Interrupting Addictive Preoccupation/Graduation. The second half of group was spent in a psycho-ed on planning for potential relapse through 'addictive preoccupation'. Members were provided with a handout on the process of preoccupation from euphoric recall to positive expectancy to obsession and finally compulsion. Members took turns reading the handout and offered examples of how they engaged in euphoric recall and how, in turn, those memories fed into their obsession and compulsion to drink/drug. The insanity of this process and subsequent relapse was discussed at length. As the session neared an end, the psycho-ed was halted and the Graduation ceremony was observed. Members ate brownies and spoke highly of the graduating member as the graduating medallion was passed around the room. The graduating member recounted her time here and admitted she will miss being here in the group and that she will pray for them all in the days ahead.   Summary: The patient reported he had worked 10 hours on Thursday and gone to bed. He had not attended any meetings yet, but planned on going to some this weekend. I reminded this  group member that he will need to secure a meeting time that fits into his work schedule. That would probably be the 5:30 meeting at the [facility] or the 6 pm [facility]. He agreed he would try those out. I asked about his father and the patient reported that he doesn't really have a good relationship with his dad, but returned to [facility] with the intention to care for him. "Nobody else will", he exclaimed. He is only here to care for him and help him financially. Within the next year, he plans to sign his father up for Social Security and then he will leave. The patient admitted he doesn't know where he will go, but he does not intend to live with his father as he does now or care for him. The patient reminded the group that he comes home to find his father in his chair drunk. That is all he does - sit in his chair and drink. He will not longer have to witness that and he admitted that it is one of the motivators for him to stay sober - because he knows he will end up just like his father if he isn't careful. When he has thoughts of drinking and how good it would taste, he admitted he thinks about his dad. The patient reported he does have cravings, but has to change his thinking and get himself doing something else. The patient is doing far better than originally anticipated and he made some good comments. He shared kind words with the graduating member and wished her well. His sobriety date remains 12/26.   Family Program: Family present? No   Name of family  member(s):   UDS collected: No Results:   AA/NA attended?: No  Sponsor?: No   Lauralynn Loeb, LCAS

## 2014-05-16 ENCOUNTER — Encounter (HOSPITAL_COMMUNITY): Payer: Self-pay | Admitting: Psychology

## 2014-05-16 NOTE — Progress Notes (Signed)
    Daily Group Progress Note  Program: CD-IOP   Group Time: 1-2:30  pm  Participation Level: Active  Behavioral Response: Appropriate and Sharing  Type of Therapy: Process Group  Topic: Process: the first part of group was spent in process. Members shared about the past weekend and what they had done over the weekend that supported their recovery. A new group member was present and he introduced himself to his new group members. His story was filled with unsuccessful treatments and a lack of desire. Today, though, this young man stated he wants to get clean for himself and not anyone else. He received good feedback and suggestions from the group. Drug tests were collected from all present during the session today.   Group Time: 2:45- 4pm  Participation Level: Active  Behavioral Response: Sharing  Type of Therapy: Psycho-education Group  Topic: Psycho-Ed: Bio-Psycho-Social-Spiritual/Guided Progressive Relaxation Exercise:  The second half of group was spent in a psycho-ed on the 4 elements that together, contribute to addiction. The 4 elements were identified with an emphasis that only the biological or genetic part is unchangeable. The other 3 can and will be addressed in treatment. There were good comments and members shared about how their childhood experiences were affected by the other 3 elements, or in the case of spirituality, by their absence. Near the end of the session, a 15 minute progressive relaxation script was read while members closed their eyes and listened attentively to the leader read from the script. Except for one member who admitted he had a sinus infection, the other members all agreed they had felt very relaxed during this reading. The importance of addressing their stressors and learning non-chemical stress management was emphasized and will be a very important part of their recovery.   Summary: The patient reported he had gone to the Happy Hour at the [employer]  after group on Friday.  On Saturday he had attended 2 AA meetings, including the 8 am [employer] and the 9:30 [employer] meeting at the [employer]. He enjoyed the [employer] meeting and noted there had been a good turnout. He also attended a meeting on Sunday. The patient reported he felt concerned about his fellow group member and didn't want her to use her pain to justify a relapse. He displayed good insight as demonstrated by his comments and feedback. In the session on addressing cravings, the patient reported he could easily relate to this and admitted he struggles with cravings for a cold beer often, but drinks water or coffee instead. The patient reminded the group that he had been raised in a family where one drank water, coffee, beer or whiskey and that was the norm. He could relate to each of the 4 elements of addiction. After the relaxation exercise, the patient admitted that he did not feel very relaxed, but he admitted he has a sinus infection and feels horrible. He is making good progress and responded well to this intervention. The patient's sobriety date remains 12/26.    Family Program: Family present? No   Name of family member(s):   UDS collected: Yes Results: not back yet  AA/NA attended?: YesFriday and Saturday  Sponsor?: No   Brnadon Eoff, LCAS

## 2014-05-17 ENCOUNTER — Other Ambulatory Visit (HOSPITAL_COMMUNITY): Payer: BLUE CROSS/BLUE SHIELD | Admitting: Psychology

## 2014-05-17 DIAGNOSIS — F1020 Alcohol dependence, uncomplicated: Secondary | ICD-10-CM | POA: Diagnosis not present

## 2014-05-18 ENCOUNTER — Encounter (HOSPITAL_COMMUNITY): Payer: Self-pay | Admitting: Psychology

## 2014-05-18 NOTE — Progress Notes (Signed)
    Daily Group Progress Note  Program: CD-IOP   Group Time: 1-2:30 pm  Participation Level: Active  Behavioral Response: Appropriate and Sharing  Type of Therapy: Psycho-education Group  Topic: Chaplain/Psychoeducation:  At the beginning of group, group members took turns checking in and telling the group what they had done since the last meeting.  Recovery-focused activities were highlighted, as well as any challenges faced in the past couple of days.  After a few group members had shared, the chaplain came into the group as a guest speaker to lead a discussion focusing on dealing with anger toward God.  Group members shared their own views of God, as well as situations in their lives when they had become angry with God.  The chaplain emphasized that how one believes influences the way one interprets life events.    Group Time: 2:45- 4pm  Participation Level: Active  Behavioral Response: Appropriate and Sharing  Type of Therapy: Process Group  Topic: Process:  Following the break, group members who had not had a chance to check in yet shared about their past couple of days.  Group members discussed any recovery-related activities they engaged in, as well as any challenges they faced.   There was one new group member in session today.  Drug tests results were given to a couple of group members, and one drug test was collected.  Summary: The patient stated that he had attended three meetings since the last group session and had been working.  He also mentioned meeting with his Catholic sponsor to discuss issues he has with getting an AA sponsor.  The patient stated that he received feedback that he should not wait too long before securing a sponsor.  He talked about his faith, and also mentioned that he has not been to church in about two years, since [place] [address] [address].  He was active during group discussion and offered feedback to other group members.  Interventions are proving  effective, and the patient's sobriety date remains 12/26.   Family Program: Family present? No   Name of family member(s):   UDS collected: Yes Results: negative  AA/NA attended?: YesFriday and Saturday  Sponsor?: No, but he is looking for one   Rydge Texidor, LCAS

## 2014-05-19 ENCOUNTER — Other Ambulatory Visit (HOSPITAL_COMMUNITY): Payer: BLUE CROSS/BLUE SHIELD | Admitting: Psychology

## 2014-05-19 DIAGNOSIS — F1020 Alcohol dependence, uncomplicated: Secondary | ICD-10-CM

## 2014-05-19 NOTE — Progress Notes (Unsigned)
Micheal Day is a 39 y.o. male patient. Met with patient after group today. He opened up about his life of drinking and the progression of his alcoholism. He began drinking at age 34  and he continued to drink heavily despite numerous consequences (2 DWIs, jail, loss of job, loss of relationships). He was raised in an environment of alcohol, observing his grandfather drinking at breakfast and his father drinking all day. The patient does not think he has any triggers to drink it was just a way of life for him. He lives with his father who continues to drink all day. The patient reports that his father's continued drinking does not influence him. The patient was able to identify some feelings around his father which include anger and resentment for having to take care of him financially. With further insight from the patient he was able to include compassion and empathy for his father. The patient is gaining insight into his emotions and is surprised at how many feelings he is feeling. He is able to identify the feelings, feel the feelings and talk about them. Such improvement! The patient is going to at least 4 meetings per week and sometimes more. He is still looking for a temporary sponsor and asked for ideas on what to look for in a sponsor. The patient has a very strong faith and opens up about it.  The patient has progressed well in his recovery. His sobriety date remains 12/26.       Connor Meacham S, Licensed Cli

## 2014-05-19 NOTE — Progress Notes (Unsigned)
Sohaib ZIDAN HELGET is a 39 y.o. male patient    Treatment Planning Session. I met with the patient after group today to complete his treatment plan. The patient was referred to treatment by his insurance company after failing a BAC test at work. It was discussed with the patient what goals he would like to work on while in CD-IOP. The client has minimal sobriety in his adult life and must learn to establish and maintain abstinence as well as gain 12-step support to maintain long-term sobriety. The patient had many questions about AA meetings, getting a sponsor and a home group. These are all requirements of CD-IOP. The patient reported that since he has stopped drinking he has saved a lot of money but is bored. He was encouraged to become ensconced in 12-step recovery and will make lots of friends who are in the program. The patient reports since he's not drinking he is feeling a lot of new feelings that he cannot identify.  We looked at a feeling wheel and talked about feelings during the session. While the patient will continue to gain more recovery time he will continue to feel more feelings. The patient decided to add an additional goal to his treatment plan to include identifying and self-expressing his feelings. The first 2 goals were to establish and maintain abstinence and gain sufficient 12-step support for on-going abstinence. The patient was satisfied with all the goals, interventions and services. The plan was reviewed, signed and completed accordingly. The patient's sobriety date is 12/26.     MACKENZIE,LISBETH S, Licensed Cli

## 2014-05-19 NOTE — Progress Notes (Unsigned)
Micheal Day is a 38 y.o. male patient    Treatment Planning Session. I met with the patient after group today to complete his treatment plan. The patient was referred to treatment by his insurance company after failing a BAC test at work. It was discussed with the patient what goals he would like to work on while in CD-IOP. The client has minimal sobriety in his adult life and must learn to establish and maintain abstinence as well as gain 12-step support to maintain long-term sobriety. The patient had many questions about AA meetings, getting a sponsor and a home group. These are all requirements of CD-IOP. The patient reported that since he has stopped drinking he has saved a lot of money but is bored. He was encouraged to become ensconced in 12-step recovery and will make lots of friends who are in the program. The patient reports since he's not drinking he is feeling a lot of new feelings that he cannot identify.  We looked at a feeling wheel and talked about feelings during the session. While the patient will continue to gain more recovery time he will continue to feel more feelings. The patient decided to add an additional goal to his treatment plan to include identifying and self-expressing his feelings. The first 2 goals were to establish and maintain abstinence and gain sufficient 12-step support for on-going abstinence. The patient was satisfied with all the goals, interventions and services. The plan was reviewed, signed and completed accordingly. The patient's sobriety date is 12/26.     MACKENZIE,LISBETH S, Licensed Cli 

## 2014-05-20 ENCOUNTER — Encounter (HOSPITAL_COMMUNITY): Payer: Self-pay | Admitting: Psychology

## 2014-05-20 NOTE — Progress Notes (Signed)
    Daily Group Progress Note  Program: CD-IOP   Group Time: 1-2:30 pm  Participation Level: Active  Behavioral Response: Appropriate and Sharing  Type of Therapy: Psycho-education Group  Topic: Psycho-Ed: The session today included a guest from the [employer]. LR came to visit the group and provide education on nutrition. She was especially concerned with specific deficiencies that addiction may bring to the individual in early recovery. The guest led a slide show on different food groups, the importance of eating a balanced meals, including frequency and amounts. Her talk included some specific information for those with diabetes. There were good questions and comments from the group members and it was a very informative session. Two drug tests were collected today and the results of those collected on Monday were returned. All were negative except for one and that had reduced numbers indicating continued abstinence. The medical director met with the 2 new group members during group today.   Group Time: 2:45- 4pm  Participation Level: Active  Behavioral Response: Sharing  Type of Therapy: Process Group  Topic: Process: The second part of group of group was spent in process. Members checked-in sharing experiences, feelings and any challenges they may have had since we last met. One member had missed the last group session and explained the legal issue that had delayed him on Wednesday. One member shared her frustrations with the lackadaisical and casual attitude about recovery displayed by some of her fellow group members. Another member disclosed about sexual dysfunction he is experiencing and his thoughts about stopping his Bipolar Medications. Feedback was invited and members expressed freely with their fellow group members.   Summary: The patient was attentive and engaged in the presentation on nutrition. He pointed out that he has collard greens and other vegetables  in the ground as we speak. The patient informed the dietician that he ate a lot of deer meat, dove, and fish and she applauded this news noting that there is considerably less fat in that type of meat versus domestic sources of animal protein. The patient appeared very knowledgeable about diet and nutrition and clearly does pretty well in this department. In process, he reported he had attended an [place] meeting on Wednesday and Thursday after work and intended to go to the 8 am and 9:30 AA meetings tomorrow. He reported he was not pleased when told he first had to go to meetings, but now he finds them enjoyable and he always learns something. The patient provided good feedback to his fellow group members and was validating and encouraging. He responded well to this intervention and is making good progress in his recovery. His sobriety date remains 12/26.   Family Program: Family present? No   Name of family member(s):   UDS collected: No Results:  AA/NA attended?: YesWednesday and Thursday  Sponsor?: No   Shakira Los, LCAS

## 2014-05-22 ENCOUNTER — Other Ambulatory Visit (HOSPITAL_COMMUNITY): Payer: BLUE CROSS/BLUE SHIELD | Admitting: Psychology

## 2014-05-22 DIAGNOSIS — F1020 Alcohol dependence, uncomplicated: Secondary | ICD-10-CM | POA: Diagnosis not present

## 2014-05-22 DIAGNOSIS — Z811 Family history of alcohol abuse and dependence: Secondary | ICD-10-CM

## 2014-05-22 DIAGNOSIS — I10 Essential (primary) hypertension: Secondary | ICD-10-CM

## 2014-05-23 ENCOUNTER — Encounter (HOSPITAL_COMMUNITY): Payer: Self-pay | Admitting: Psychology

## 2014-05-23 NOTE — Progress Notes (Signed)
Daily Group Progress Note  Program: CD-IOP   Group Time: 1-2:30 pm  Participation Level: Active  Behavioral Response: Appropriate and Sharing  Type of Therapy: Process Group  Topic: Process: The first part of group was spent in process. Members shared about the past weekend and any challenges or successes in early recovery. They were asked to share about anything they did to support a healthy recovery-lifestyle. A guest was present from [facility]. A woman in the PA program at [facility] visited with the group. During this session, drug tests were collected from everyone present.   Group Time: 2:45- 4pm  Participation Level: Active  Behavioral Response: Sharing and Resistant  Type of Therapy: Psycho-education Group  Topic: Steps 1-3: The Meaning and Purpose behind the First 3 Steps/Sleep Hygiene. A presentation was provided on the purpose or meaning behind Steps 1-3. A handout was provided to group members with the 12 Steps and the principles behind all 12 steps. A lecture followed on the meaning or purpose of the first 3 steps. Steps 1-3 are the mechanics for "How One Surrenders". The session ended with a handout and discussion on Sleep Hygiene. The importance of preparing for one's sleep was discussed in detail. The session was lively with good feedback among members.   Summary: The patient reported he had attended an AA meeting on Friday evening after group, had gone home, made dinner and went to bed. He explained that he had intended to go to a meeting on Saturday, but ended up driving his father around running errands and trying to get their wood splitter fixed. In response to previous comments about triggers, the patient reported an interesting experience he had. The patient reported he had gone to an old convenience store where he bought his beer. He walked in and the cashier had rung up an 18 pack of beer, which was his usual purchase. He had only come in for a cup of coffee and  was surprised to see the cup costing $12.43. The patient informed her that he was in recovery and no longer drinking. She had applauded this news. In the presentation on the Steps, the patient admitted he hadn't gotten a sponsor and hadn't even considered it. He reminded the group that he hadn't been able to meet with the [facility], but he seemed unwilling to even consider a sponsor. When I wondered out loud what might be the benefit of delaying or putting off getting a sponsor, one member noted that it kept him from being accountable. Another member said to do the opposite of your initial response and that he should secure one ASAP. His resistance to sponsorship appears very incongruent based on the alcoholic drinking and consequences experienced by this patient for most of his adult life. There was not much evidence of faith or spirituality in those years, but suddenly, he won't consider a sponsor until he speaks with a member of the [employer]?  This issue will be explored more in upcoming individual sessions along with group. During the discussion on sleep, this patient reported he slept with his windows open. Most of the group members couldn't fathom this disclosure and demanded to know more. One member couldn't imagine how he stayed warm? The patient made some good comments and responded well to this intervention. He continues to make amazingly good progress in early recovery. His sobriety date remains 12/26.   Family Program: Family present? No   Name of family member(s):   UDS collected: Yes  Results: not back from lab  AA/NA attended?: YesFriday  Sponsor?: No   Neela Zecca, LCAS

## 2014-05-24 ENCOUNTER — Encounter (HOSPITAL_COMMUNITY): Payer: Self-pay | Admitting: Licensed Clinical Social Worker

## 2014-05-24 ENCOUNTER — Other Ambulatory Visit (HOSPITAL_COMMUNITY): Payer: BLUE CROSS/BLUE SHIELD | Admitting: Psychology

## 2014-05-24 DIAGNOSIS — Z811 Family history of alcohol abuse and dependence: Secondary | ICD-10-CM

## 2014-05-24 DIAGNOSIS — I10 Essential (primary) hypertension: Secondary | ICD-10-CM

## 2014-05-24 DIAGNOSIS — F1020 Alcohol dependence, uncomplicated: Secondary | ICD-10-CM | POA: Diagnosis not present

## 2014-05-25 NOTE — Progress Notes (Unsigned)
Micheal Day is a 39 y.o. male patient.  CD-IOP: Individual Counseling Session. I met with the patient after group for his weekly individual session. The patient completed the CAGE assessment tool. The patient has experienced exhaustion around 2pm for the past 2 days.  I told him to keep track of this and we can talk to our PA about it. He lives with his father and cares for him financially. He will continue to support him until his father gets his social security check which is June. The patient reports he is considering moving to an [employer] and still care for his father. The patient is working on his routine of sleeping normal hours, eating healthier, going to meetings regularly. He is now working on getting a sponsor and has someone he is interested in asking. The patient has accepted his alcoholism and is no longer in denial. He patient reported "I now understand there is a future for me and the possibilities are endless." He is excited about the future he can have which doesn't include alcohol. The patient is working on his Step 1 and is struggling with the wording of were vs are powerless over our addiction. I encouraged him to have an open mind about the wording of the first Step and to write down all his thoughts, questions, and opinions about Step 1. The patient's sobriety date remains 12/26.      Arrick Dutton S, Licensed Cli

## 2014-05-26 ENCOUNTER — Other Ambulatory Visit (HOSPITAL_COMMUNITY): Payer: BLUE CROSS/BLUE SHIELD

## 2014-05-29 ENCOUNTER — Other Ambulatory Visit (HOSPITAL_COMMUNITY): Payer: BLUE CROSS/BLUE SHIELD | Admitting: Psychology

## 2014-05-29 ENCOUNTER — Encounter (HOSPITAL_COMMUNITY): Payer: Self-pay | Admitting: Psychology

## 2014-05-29 DIAGNOSIS — F1020 Alcohol dependence, uncomplicated: Secondary | ICD-10-CM | POA: Diagnosis not present

## 2014-05-29 NOTE — Progress Notes (Signed)
    Daily Group Progress Note  Program: CD-IOP   Group Time: 1-2:30 pm  Participation Level: Minimal  Behavioral Response: Appropriate  Type of Therapy: Psycho-education Group  Topic: Process/Psychoeducation:  After checking in, group members took turns sharing about recovery related activities they had engaged in since the last group meeting, as well as any challenges they had faced.  During this time, two representatives from the [facility] [place] came into group to share their stories of recovery, as well as to share about different groups and classes offered in the [place] area.  Group Time: 2:45- 4pm  Participation Level: Active  Behavioral Response: Sharing  Type of Therapy: Process Group  Topic: Process/Step One:  After the break, group members continued sharing about their recovery-related activities and challenges faced since the last group meeting.  There were two new group members today, and each of them introduced themselves and shared what has led them to treatment at this time. One group member took the time to read his Step One work today as well.  One Drug tests were collected today, and drug test results were returned to some group members.  Summary: The patient reported that he attended meetings on Monday and Tuesday, and mentioned that he saw another group member there.  He also stated that he mentioned needing a sponsor in the meeting and received some numbers from other men who were there.  The patient worked yesterday and today, but still has not scheduled a meeting with the priest he has been hoping to speak with.  The patient responded well to this intervention and continues to make progress in his recovery. His sobriety date remains 12/26.  Family Program: Family present? No   Name of family member(s):   UDS collected: Yes Results: negative  AA/NA attended?: [place] Tuesday  Sponsor?: No   Winna Golla, LCAS

## 2014-05-31 ENCOUNTER — Other Ambulatory Visit (HOSPITAL_COMMUNITY): Payer: BLUE CROSS/BLUE SHIELD | Admitting: Licensed Clinical Social Worker

## 2014-05-31 DIAGNOSIS — F1020 Alcohol dependence, uncomplicated: Secondary | ICD-10-CM

## 2014-06-01 ENCOUNTER — Encounter (HOSPITAL_COMMUNITY): Payer: Self-pay | Admitting: Licensed Clinical Social Worker

## 2014-06-01 ENCOUNTER — Encounter (HOSPITAL_COMMUNITY): Payer: Self-pay | Admitting: Psychology
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
## 2014-06-01 NOTE — Progress Notes (Signed)
    Daily Group Progress Note  Program: CD-IOP   Group Time: 1-2:30 pm  Participation Level: Active  Behavioral Response: Appropriate and Sharing  Type of Therapy: Process Group  Topic: After checking in, group members took turns sharing about any recovery-related activities they engaged in since the last group meeting, as well as any challenges they faced.  The group was engaged during the process portion of group.  Drug tests were collected from some group members, and results of tests taken on Monday were returned.   Group Time: 2:45-4pm  Participation Level: Active  Behavioral Response: Appropriate and Sharing  Type of Therapy: Psycho-education Group  Topic: The second part of group focused on communication skills.  The four types of communication were discussed and group members identified what type of communication they use in their lives.  Group members gave examples of situations in which they were passive, aggressive, or passive-aggressive, and learned the importance of assertive communication.  Group members learned how to be more assertive and were asked to pay more attention to situations in their own lives where they need to communicate more effectively.  Summary: The patient stated that he attended a meeting on both Monday and Tuesday, and picked up his 30 day chip last night.  He has also worked since the group last met.  The patient provided feedback to other group members, including sharing his own experiences with "tough love".  During the psychoeducation portion of group, he shared examples of his own communication style and how it has not always served him well.  He also told the group that his father is a very angry person and can be verbally aggressive.  The patient is doing well and his sobriety date remains 12/26.  Family Program: Family present? No   Name of family member(s):   UDS collected: Yes Results: Pending  AA/NA attended?: YesMonday and Tuesday  Sponsor?: Yes   MACKENZIE,LISBETH S, Licensed Cli

## 2014-06-02 ENCOUNTER — Other Ambulatory Visit (HOSPITAL_COMMUNITY): Payer: BLUE CROSS/BLUE SHIELD | Admitting: Psychology

## 2014-06-02 DIAGNOSIS — Z811 Family history of alcohol abuse and dependence: Secondary | ICD-10-CM

## 2014-06-02 DIAGNOSIS — I10 Essential (primary) hypertension: Secondary | ICD-10-CM

## 2014-06-02 DIAGNOSIS — F1020 Alcohol dependence, uncomplicated: Secondary | ICD-10-CM | POA: Diagnosis not present

## 2014-06-05 ENCOUNTER — Encounter (HOSPITAL_COMMUNITY): Payer: Self-pay | Admitting: Licensed Clinical Social Worker

## 2014-06-05 ENCOUNTER — Encounter (HOSPITAL_COMMUNITY): Payer: Self-pay | Admitting: Psychology

## 2014-06-05 ENCOUNTER — Other Ambulatory Visit (HOSPITAL_COMMUNITY): Payer: BLUE CROSS/BLUE SHIELD | Attending: Psychiatry | Admitting: Licensed Clinical Social Worker

## 2014-06-05 DIAGNOSIS — F1020 Alcohol dependence, uncomplicated: Secondary | ICD-10-CM

## 2014-06-05 DIAGNOSIS — R45851 Suicidal ideations: Secondary | ICD-10-CM | POA: Diagnosis not present

## 2014-06-05 DIAGNOSIS — R440 Auditory hallucinations: Secondary | ICD-10-CM | POA: Insufficient documentation

## 2014-06-05 DIAGNOSIS — F4310 Post-traumatic stress disorder, unspecified: Secondary | ICD-10-CM | POA: Insufficient documentation

## 2014-06-05 DIAGNOSIS — F10239 Alcohol dependence with withdrawal, unspecified: Secondary | ICD-10-CM | POA: Diagnosis present
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
## 2014-06-06 ENCOUNTER — Encounter (HOSPITAL_COMMUNITY): Payer: Self-pay | Admitting: Licensed Clinical Social Worker

## 2014-06-06 NOTE — Progress Notes (Unsigned)
Micheal Day is a 39 y.o. male patient . CD-IOP. Treatment Planning Review Session. Reviewed treatment plan with patient and discussed progress on each goal. The patient just picked up his 30 day chip at the [place] meeting last week. He is still looking for a temporary sponsor. He desires to have a sponsor that is [place] and met with a [employer] from [place] to assist him in the process of finding a Production manager. The patient is working on Step one but has lots of issues with the verbage in the step. He is very critical of 12-step recovery and reports when he completes the program he will not go to meetings nor keep a sponsor. He feels like he has been made to go to meetings and I reminded him that the meetings are part of the program requirements. I have suggested that he visit different meetings to find if any resonate with him. He reports he went to 6 meetings last week. He is set to complete CD-IOP in 3 weeks and I asked how he will continue in his sobriety without a healthy support system. The patient reports he feels accountability to stay clean by going to meetings but has issues with the meetings themselves as well as the steps. The patient has been thinking about going to church again and thinks that may be a way to meet like-minded people. The patient will meet again this week with individual counselor to discuss his future plans for on-going sobriety. His sobriety date remains 12/26.        Micheal Day, Licensed Cli

## 2014-06-06 NOTE — Progress Notes (Signed)
    Daily Group Progress Note  Program: CD-IOP   Group Time: 1-2:30  Participation Level: Minimal  Behavioral Response: Resistant  Type of Therapy: Process Group  Topic: After checking in, all group members reported what they had done for their recovery since the last group session. Drug tests were given out to all group members and two drug test results were returned.    Group Time: 2:45-4  Participation Level: Minimal  Behavioral Response: Resistant  Type of Therapy: Psycho-education Group  Topic: The second part of group focused on boundary setting. A power-point was used to discuss the different types of boundaries that people use: Wall, permeable, and healthy boundaries. The different types of boundaries were discussed and a handout was given out to each group member. Group members discussed which boundary they felt comfortable using. Group members were able to role play scenarios using healthy boundaries effectively.   Summary: Patient went to meetings Friday and Saturday. He went with another group member to the [employer] Saturday morning and showed the location of the Friday night meeting to another group member. The patient reported he watched basketball Sunday. The patient participated in the role play but was inappropriate in his response to the boundary scenario.  The patient was unable to identify what boundary style he uses in everyday life. The drug test was returned to the patient. His sobriety date remains 12/26.   Family Program: Family present? No   Name of family member(s):   UDS collected: Yes Results: Pending  AA/NA attended?: YesFriday and Saturday  Sponsor?: No   MACKENZIE,LISBETH S, Licensed Cli

## 2014-06-07 ENCOUNTER — Encounter: Payer: Self-pay | Admitting: Psychiatry

## 2014-06-07 ENCOUNTER — Encounter (HOSPITAL_COMMUNITY): Payer: Self-pay | Admitting: Psychology

## 2014-06-07 ENCOUNTER — Other Ambulatory Visit (HOSPITAL_COMMUNITY): Payer: BLUE CROSS/BLUE SHIELD

## 2014-06-08 NOTE — Progress Notes (Signed)
Micheal Day is a 39 y.o. male patient. CD-IOP: Refused Drug Test. The patient arrived for group today about 20 minutes early. He appeared to be in a good mood and ready for the session. Prior to the session beginning, I handed him a drug test and requested that he provide a specimen today. This was one of two random drug tests being collected from group members today. Tests had been collected from everyone, including this patient, on Monday. When I returned from preparing the coffee, the patient met me out in the hallway and reported he had to leave. He stated his father had phoned and wanted him to come home immediately. He would not be able to provide a sample. I emphasized the importance of his recovery and suggested that he phone his father and explain he would be there in an hour. The patient reported his father's good friend was in the hospital in [place] and he wanted his son to drive him there immediately. The consequences of leaving today seemed to outweigh his father's demands, but the patient did not see it this way and insisted he had to leave. He apologized to the group and left the building. This refusal to complete a drug test is regarded as a positive drug test. After group session,  I phoned the patient's employer and left a message detailing the events of the day. This is only the second positive drug test for this patient. He is still eligible for the program and can still graduate successfully, but he will have to return, complete the required group sessions and remain alcohol-free for the duration of the program.         Breck Maryland, LCAS

## 2014-06-09 ENCOUNTER — Encounter (HOSPITAL_COMMUNITY): Payer: Self-pay

## 2014-06-09 ENCOUNTER — Other Ambulatory Visit (INDEPENDENT_AMBULATORY_CARE_PROVIDER_SITE_OTHER): Payer: BLUE CROSS/BLUE SHIELD | Admitting: Medical

## 2014-06-09 ENCOUNTER — Encounter (HOSPITAL_COMMUNITY): Payer: Self-pay | Admitting: Licensed Clinical Social Worker

## 2014-06-09 DIAGNOSIS — Z6372 Alcoholism and drug addiction in family: Secondary | ICD-10-CM

## 2014-06-09 DIAGNOSIS — F4310 Post-traumatic stress disorder, unspecified: Secondary | ICD-10-CM

## 2014-06-09 DIAGNOSIS — Z91199 Patient's noncompliance with other medical treatment and regimen due to unspecified reason: Secondary | ICD-10-CM | POA: Insufficient documentation

## 2014-06-09 DIAGNOSIS — F10239 Alcohol dependence with withdrawal, unspecified: Secondary | ICD-10-CM

## 2014-06-09 DIAGNOSIS — Z9119 Patient's noncompliance with other medical treatment and regimen: Secondary | ICD-10-CM

## 2014-06-09 MED ORDER — NALTREXONE HCL 50 MG PO TABS: 50.0000 mg | ORAL_TABLET | Freq: Every day | ORAL | Status: AC

## 2014-06-09 NOTE — Progress Notes (Unsigned)
Terance HartBattie L Gladhill is a 39 y.o. male patient. Patient came to meet with this counselor before group today. He is trying to set up his own treatment program. He wanted to meet with the program director, PA Eloisa [place] to determine what he can do in lieu of AA meetings which he hates. The patient will return to CD-IOP on Monday 2/8. The program director gave the patient information on [facility] and [facility] and will attend those meetings in lieu of AA. He has 8 groups left to complete the program. The patient reports he drank Wednesday and Thursday; of this week. The PA  Prescribed Naltrexone 50 mg daily at the patient's request. Will do a UA  On Monday 2/8. I will continue to see the patient individually.  The new sobriety date is 2/6.        MACKENZIE,LISBETH S, Licensed Cli

## 2014-06-09 NOTE — Progress Notes (Addendum)
   [facility] Follow-up Outpatient Visit  Micheal Day 07/15/1975  Date: 06/09/2014   Subjective: Pt seen due 1 day relapse after walking out of group on Wednesday c/o being "fed up  with groups and AA"-Wants "more scientific" treatment with medication and alternatives to his perception of "phony AA"-what he perceives as AAs religious bent "although they deny it"-"this holding hands and praying crap didnt work for the plague either". He refused his UDS prior to W[place] group.  He says he saw his boss on Weds and his Supervisor on Thursday and was told whatever CD IOP worked out would be his outcome. He is willing to accept [facility] and  (ironically) Christian based [facility] as possible alternatives.He is willing to take Naltrexone but doesnt want antabuse or Campral.He sates a number of times he has "really tried" to do CD IOP including attending 6 AA meetings a week but can no longer tolerate this treatment program.Continues to live with/care for drinking alcoholic father.Admits when he sees a beer he wants to drink it.   There were no vitals filed for this visit.  Mental Status Examination  Appearance: Fairly groomed Alert: Yes Attention: good  Cooperative: Yes Eye Contact: Good Speech: Normal/comprehensible .Whern he gets excited tempo and tone increase but he is not yelling Psychomotor Activity: Increased  Memory/Concentration: Intact Oriented: person, place, time/date and situation Mood: Angry and Irritable  But willing to consider options Affect: Congruent Thought Processes and Associations: Coherent, Goal Directed and emotionalyl driven Fund of Knowledge: Faird Thought Content: Suicidal ideation, Homicidal ideation, Auditory hallucinations, Visual hallucinations, Delusions and Paranoia Insight: Poor Judgement: Fair  Diagnosis: Alcohol dependence with withdrawal with complications ;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

## 2014-06-12 ENCOUNTER — Encounter (HOSPITAL_COMMUNITY): Payer: Self-pay | Admitting: Psychology

## 2014-06-12 ENCOUNTER — Other Ambulatory Visit (HOSPITAL_COMMUNITY): Payer: BLUE CROSS/BLUE SHIELD | Admitting: Psychology

## 2014-06-12 DIAGNOSIS — F10239 Alcohol dependence with withdrawal, unspecified: Secondary | ICD-10-CM | POA: Diagnosis not present

## 2014-06-13 ENCOUNTER — Encounter (HOSPITAL_COMMUNITY): Payer: Self-pay | Admitting: Psychology

## 2014-06-13 NOTE — Progress Notes (Signed)
Micheal Day is a 39 y.o. male patient. CD-IOP: Discharge. I met with the patient at the conclusion of the group session today. I asked why he had opted to drink over the weekend knowing that it would result in a discharge. He seemed unable to explain it, but admitted he knew he had to figure out a way to stop drinking. I explained that we would encourage him to enter residential treatment. The patient shook his head and reported he had once done 30 days and almost went crazy. I asked about this, and he explained [place]. He couldn't possibly handle that again. I explained that we were referring to a residential treatment facility, but he would not hear it. The patient stated he was going to pick up the Naltrexone script at the drug store and wondered whether he would feel the immediate difference? I described the benefits some experience from this medication, but explained that any pronounced is not to be expected. The patient thanked me and I wished him well. There were tears in his eyes as he left. We will discharge from the CD-IOP and inform his employer of this action. The patient leaves the program with a sobriety date of 06/12/14.         Ritisha Deitrick, LCAS

## 2014-06-13 NOTE — Progress Notes (Signed)
    Daily Group Progress Note  Program: CD-IOP   Group Time: 1-2:30 pm  Participation Level: Active  Behavioral Response: Sharing, Rigid and Passive-Aggressive  Type of Therapy: Process Group  Topic: Process; the first part of group was spent process. Members shared about the past weekend and any issues or concerns that may have presented themselves. They also identified things they did that supported their early recovery. Also present today were 2 new group members. During this half of group they both introduced themselves and shared a little bit about what had brought them here.   Group Time: 2:45- 4pm  Participation Level: Minimal  Behavioral Response: Sharing  Type of Therapy: Psycho-education Group  Topic: Psycho-Ed: the second half of group was largely spent watching the film, "My Name was Bette". The film was about a woman, named Therapist, artBette, who had lost herself to alcoholism and eventually died from it. Her two daughters were the primary speakers and they recounted their mother's deterioration and subsequent death. The film also focused on the ways that alcohol damages a woman's body more quickly than a man's. There was a short time to discuss the film and almost every group member found it both powerful and painful.    Summary: The patient checked-in with a sobriety date of today. When I questioned him about this after everyone had checked in, the patient admitted he had been drinking all weekend, including yesterday. He expressed frustration with the group and shared that he had spoken with the medical director on Friday and asked that he not be required to attend AA meetings. He finds them very 'religious'. The patient also admitted he had taken his chips and 'burned them'.  The patient reported he had gone to the drug store on Friday, but the medication he had been prescribed, Naltrexone, was not available. He had phoned again today, and been informed that they had it ready for him.  I reminded the patient that with this disclosure about his drinking, it would be his 3rd relapse. He nodded and stated that he understood this. When he asked whether he should leave, I asked his fellow group members and they requested that he be allowed to remain for the session. The patient was attentive, but shared little else of him. He admitted as the session neared an end, that the film on Bette was very painful because it reminds him of his father. It added further motivation for him not to end up that way. The patient leaves us having relapsed 3 times. He will be referred to a higher level of care. The patient leaves us with a sobriety date of 2/8.   Family Program: Family present? No   Name of family member(s):   UDS collected: No Results:But patient shared that he had drunk over the weekend and that represents his 3rd relapse  AA/NA attended?: No  Sponsor?: No   Evanna Washinton, LCAS

## 2014-06-14 ENCOUNTER — Encounter: Payer: Self-pay | Admitting: Psychiatry

## 2014-06-14 ENCOUNTER — Other Ambulatory Visit (HOSPITAL_COMMUNITY): Payer: BLUE CROSS/BLUE SHIELD

## 2014-06-16 ENCOUNTER — Other Ambulatory Visit (HOSPITAL_COMMUNITY): Payer: BLUE CROSS/BLUE SHIELD

## 2014-06-19 ENCOUNTER — Other Ambulatory Visit (HOSPITAL_COMMUNITY): Payer: BLUE CROSS/BLUE SHIELD

## 2014-06-21 ENCOUNTER — Other Ambulatory Visit (HOSPITAL_COMMUNITY): Payer: BLUE CROSS/BLUE SHIELD

## 2014-06-23 ENCOUNTER — Other Ambulatory Visit (HOSPITAL_COMMUNITY): Payer: BLUE CROSS/BLUE SHIELD

## 2014-06-26 ENCOUNTER — Other Ambulatory Visit (HOSPITAL_COMMUNITY): Payer: BLUE CROSS/BLUE SHIELD

## 2014-06-28 ENCOUNTER — Other Ambulatory Visit (HOSPITAL_COMMUNITY): Payer: BLUE CROSS/BLUE SHIELD

## 2014-06-28 ENCOUNTER — Encounter: Payer: Self-pay | Admitting: Psychiatry

## 2014-06-30 ENCOUNTER — Other Ambulatory Visit (HOSPITAL_COMMUNITY): Payer: BLUE CROSS/BLUE SHIELD

## 2014-07-03 ENCOUNTER — Other Ambulatory Visit (HOSPITAL_COMMUNITY): Payer: BLUE CROSS/BLUE SHIELD

## 2014-07-05 ENCOUNTER — Other Ambulatory Visit (HOSPITAL_COMMUNITY): Payer: BLUE CROSS/BLUE SHIELD

## 2014-07-07 ENCOUNTER — Other Ambulatory Visit (HOSPITAL_COMMUNITY): Payer: BLUE CROSS/BLUE SHIELD

## 2014-07-10 ENCOUNTER — Other Ambulatory Visit (HOSPITAL_COMMUNITY): Payer: BLUE CROSS/BLUE SHIELD

## 2014-07-12 ENCOUNTER — Other Ambulatory Visit (HOSPITAL_COMMUNITY): Payer: BLUE CROSS/BLUE SHIELD

## 2014-10-30 ENCOUNTER — Encounter (HOSPITAL_BASED_OUTPATIENT_CLINIC_OR_DEPARTMENT_OTHER): Payer: Self-pay | Admitting: *Deleted

## 2014-10-30 ENCOUNTER — Emergency Department (HOSPITAL_BASED_OUTPATIENT_CLINIC_OR_DEPARTMENT_OTHER): Payer: BLUE CROSS/BLUE SHIELD

## 2014-10-30 ENCOUNTER — Emergency Department (HOSPITAL_BASED_OUTPATIENT_CLINIC_OR_DEPARTMENT_OTHER)
Admission: EM | Admit: 2014-10-30 | Discharge: 2014-10-30 | Disposition: A | Discharge status: Home / Self Care | Payer: BLUE CROSS/BLUE SHIELD | Attending: Emergency Medicine | Admitting: Emergency Medicine

## 2014-10-30 DIAGNOSIS — Y998 Other external cause status: Secondary | ICD-10-CM | POA: Insufficient documentation

## 2014-10-30 DIAGNOSIS — Z23 Encounter for immunization: Secondary | ICD-10-CM | POA: Insufficient documentation

## 2014-10-30 DIAGNOSIS — S060X1A Concussion with loss of consciousness of 30 minutes or less, initial encounter: Secondary | ICD-10-CM

## 2014-10-30 DIAGNOSIS — S0181XA Laceration without foreign body of other part of head, initial encounter: Secondary | ICD-10-CM

## 2014-10-30 DIAGNOSIS — S60511A Abrasion of right hand, initial encounter: Secondary | ICD-10-CM

## 2014-10-30 DIAGNOSIS — Y9301 Activity, walking, marching and hiking: Secondary | ICD-10-CM | POA: Insufficient documentation

## 2014-10-30 DIAGNOSIS — M79641 Pain in right hand: Secondary | ICD-10-CM

## 2014-10-30 DIAGNOSIS — Y9289 Other specified places as the place of occurrence of the external cause: Secondary | ICD-10-CM | POA: Insufficient documentation

## 2014-10-30 DIAGNOSIS — Z79899 Other long term (current) drug therapy: Secondary | ICD-10-CM | POA: Insufficient documentation

## 2014-10-30 DIAGNOSIS — I10 Essential (primary) hypertension: Secondary | ICD-10-CM | POA: Insufficient documentation

## 2014-10-30 DIAGNOSIS — S0512XA Contusion of eyeball and orbital tissues, left eye, initial encounter: Secondary | ICD-10-CM

## 2014-10-30 MED ORDER — LIDOCAINE-EPINEPHRINE-TETRACAINE (LET) SOLUTION
3.0000 mL | Freq: Once | NASAL | Status: AC
  Administered 2014-10-30: 3 mL via TOPICAL
  Filled 2014-10-30: qty 3

## 2014-10-30 MED ORDER — TETANUS-DIPHTH-ACELL PERTUSSIS 5-2.5-18.5 LF-MCG/0.5 IM SUSP
0.5000 mL | Freq: Once | INTRAMUSCULAR | Status: AC
  Administered 2014-10-30: 0.5 mL via INTRAMUSCULAR
  Filled 2014-10-30: qty 0.5
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
## 2014-10-30 NOTE — ED Notes (Signed)
MD at bedside. 
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
## 2014-10-30 NOTE — Discharge Instructions (Signed)
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
## 2015-05-25 ENCOUNTER — Encounter: Payer: Self-pay | Admitting: Family Medicine

## 2015-05-30 ENCOUNTER — Encounter: Payer: Self-pay | Admitting: Family Medicine

## 2016-01-31 IMAGING — CR DG FOOT COMPLETE 3+V*L*
2 series · 2 of 2 positions shown · non-contrast
Comparison: None.

CLINICAL DATA: Left foot numbness.

EXAM:
LEFT FOOT - COMPLETE 3+ VIEW

[AP]
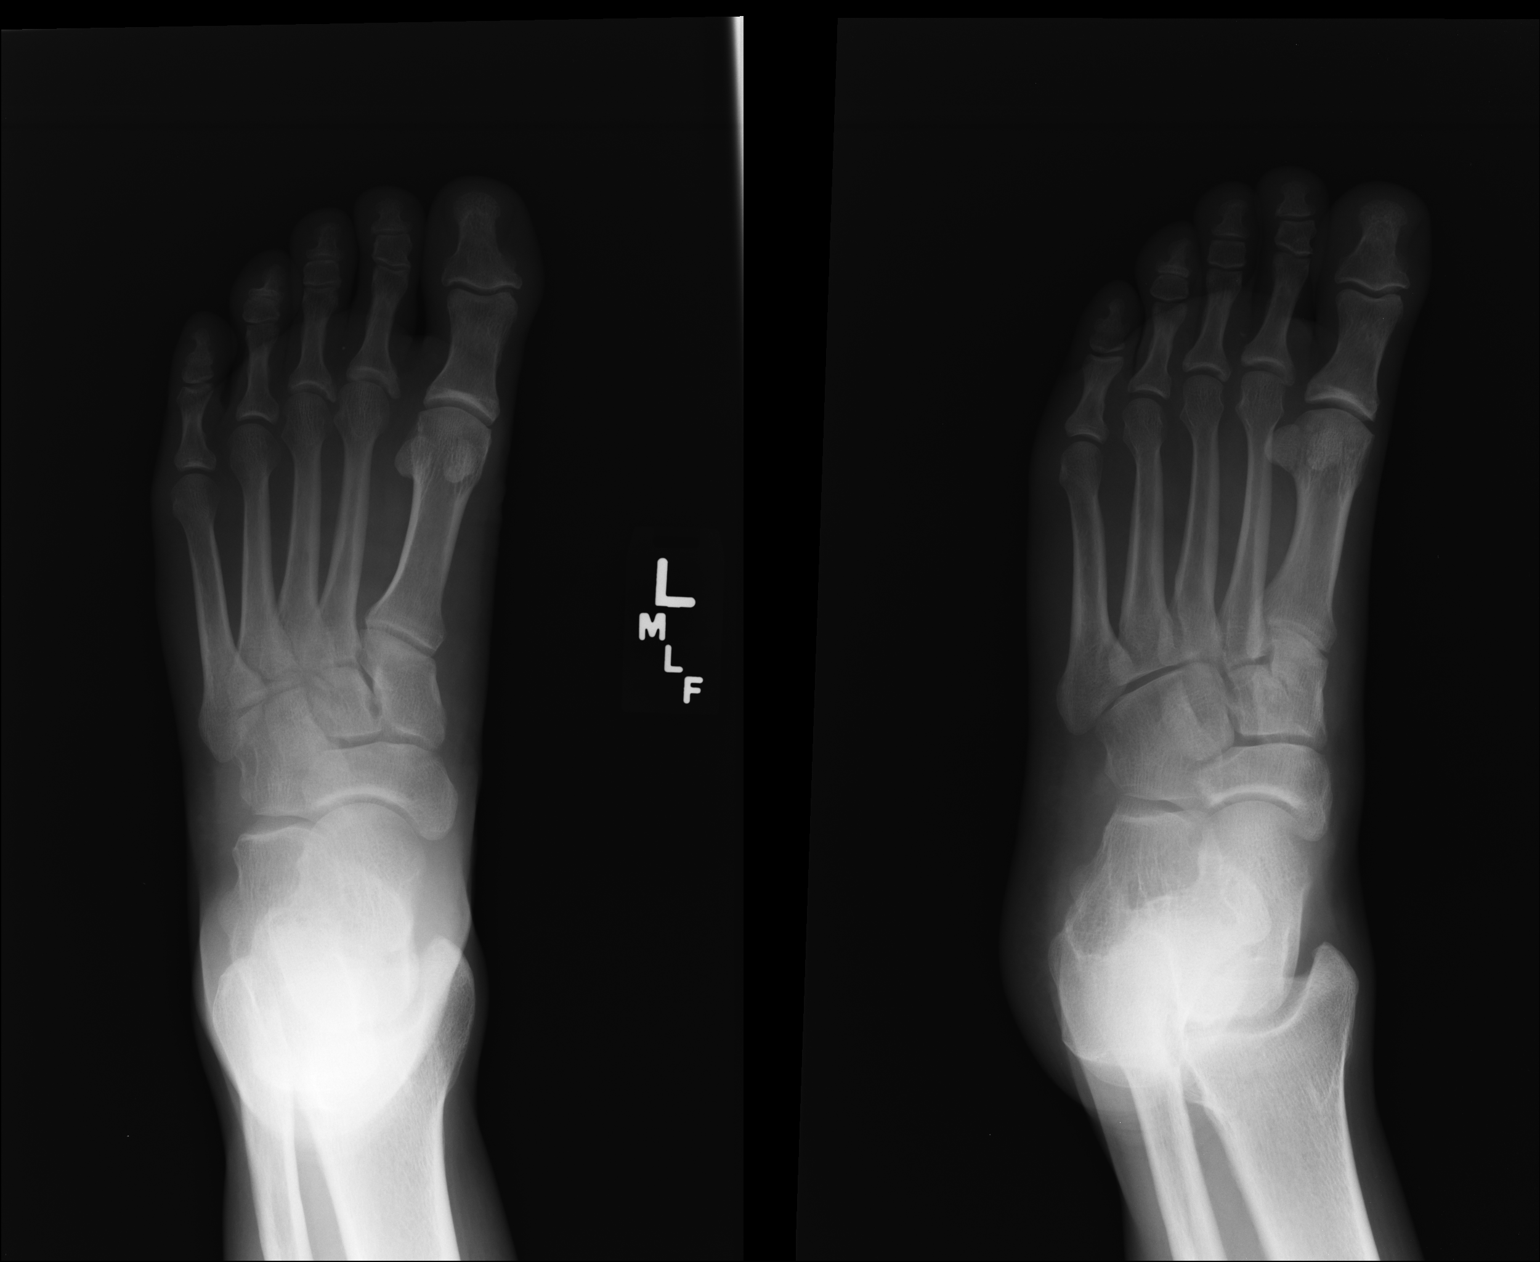

[ap obl int rot]
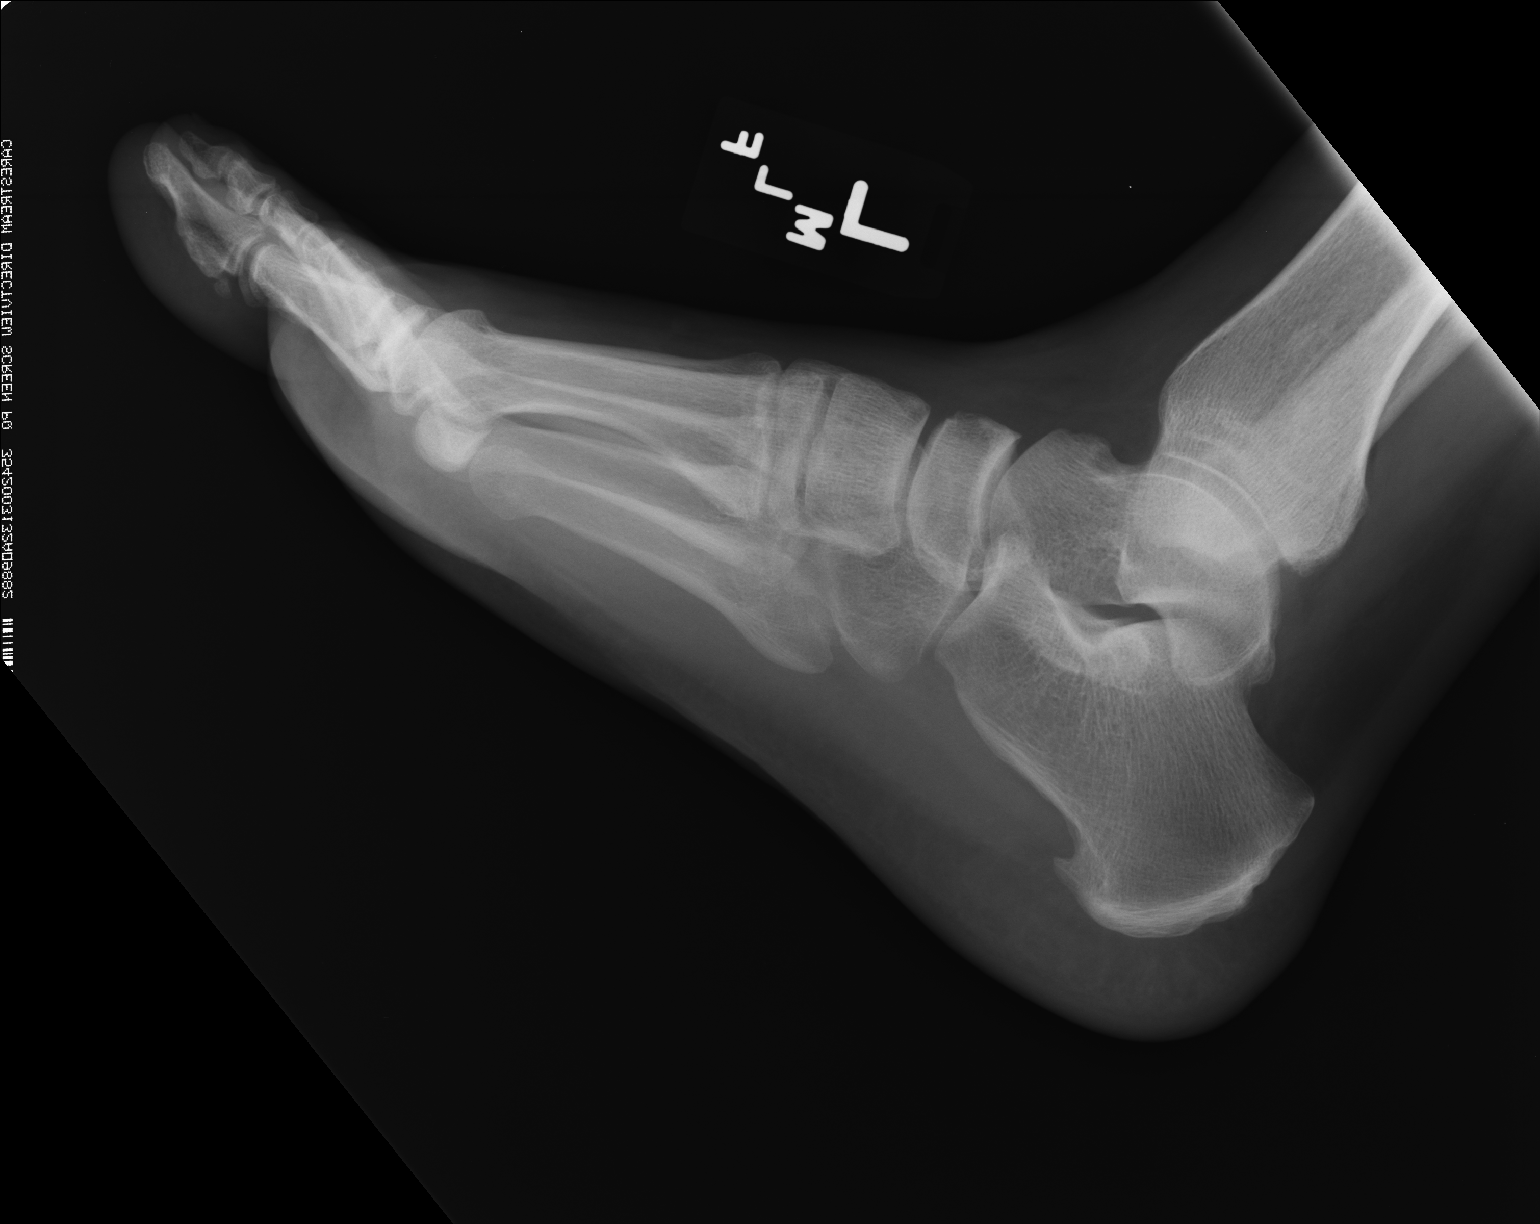

[2 of 2 positions shown; findings below may reference images not displayed]

FINDINGS: Three-view exam shows no evidence for an acute fracture. No
subluxation or dislocation. Tiny 2 mm radiopaque foreign body is
identified in the soft tissues between the third and second toes.
IMPRESSION: No acute bony findings.
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
